# Patient Record
Sex: Female | Born: 1970 | Race: White | Hispanic: No | Marital: Married | Smoking: Former smoker
Health system: Southern US, Community
[De-identification: ages and names within clinical notes are randomized; demographics above are authoritative.]

## PROBLEM LIST (undated history)

## (undated) DIAGNOSIS — E079 Disorder of thyroid, unspecified: Secondary | ICD-10-CM

## (undated) DIAGNOSIS — T7840XA Allergy, unspecified, initial encounter: Secondary | ICD-10-CM

## (undated) DIAGNOSIS — E119 Type 2 diabetes mellitus without complications: Secondary | ICD-10-CM

## (undated) HISTORY — PX: FRACTURE SURGERY: SHX138

## (undated) HISTORY — DX: Disorder of thyroid, unspecified: E07.9

## (undated) HISTORY — DX: Type 2 diabetes mellitus without complications: E11.9

## (undated) HISTORY — DX: Allergy, unspecified, initial encounter: T78.40XA

---

## 2006-03-19 ENCOUNTER — Emergency Department: Payer: Self-pay | Admitting: Emergency Medicine

## 2006-10-16 ENCOUNTER — Ambulatory Visit: Payer: Self-pay | Admitting: Gastroenterology

## 2007-12-02 ENCOUNTER — Ambulatory Visit: Payer: Self-pay | Admitting: Unknown Physician Specialty

## 2009-07-18 ENCOUNTER — Ambulatory Visit: Payer: Self-pay | Admitting: Unknown Physician Specialty

## 2009-08-31 ENCOUNTER — Emergency Department: Payer: Self-pay | Admitting: Emergency Medicine

## 2012-12-13 ENCOUNTER — Inpatient Hospital Stay: Payer: Self-pay | Admitting: Unknown Physician Specialty

## 2012-12-13 LAB — CBC
HCT: 44.5 % (ref 35.0–47.0)
HGB: 14.6 g/dL (ref 12.0–16.0)
MCH: 27.3 pg (ref 26.0–34.0)
MCHC: 32.8 g/dL (ref 32.0–36.0)
Platelet: 302 10*3/uL (ref 150–440)
WBC: 11.8 10*3/uL — ABNORMAL HIGH (ref 3.6–11.0)

## 2012-12-13 LAB — COMPREHENSIVE METABOLIC PANEL
Alkaline Phosphatase: 95 U/L (ref 50–136)
BUN: 12 mg/dL (ref 7–18)
Bilirubin,Total: 0.5 mg/dL (ref 0.2–1.0)
Calcium, Total: 9.6 mg/dL (ref 8.5–10.1)
EGFR (African American): >60
EGFR (Non-African Amer.): >60
Glucose: 138 mg/dL — ABNORMAL HIGH (ref 65–99)
Osmolality: 279 (ref 275–301)
Potassium: 3.6 mmol/L (ref 3.5–5.1)
SGPT (ALT): 16 U/L (ref 12–78)
Total Protein: 7.7 g/dL (ref 6.4–8.2)

## 2012-12-15 LAB — BASIC METABOLIC PANEL
BUN: 7 mg/dL (ref 7–18)
Chloride: 103 mmol/L (ref 98–107)
Creatinine: 0.63 mg/dL (ref 0.60–1.30)
EGFR (African American): >60
EGFR (Non-African Amer.): >60
Glucose: 172 mg/dL — ABNORMAL HIGH (ref 65–99)
Potassium: 3.9 mmol/L (ref 3.5–5.1)
Sodium: 132 mmol/L — ABNORMAL LOW (ref 136–145)

## 2012-12-15 LAB — PLATELET COUNT: Platelet: 216 10*3/uL (ref 150–440)

## 2012-12-16 LAB — BASIC METABOLIC PANEL
Anion Gap: 6 — ABNORMAL LOW (ref 7–16)
BUN: 5 mg/dL — ABNORMAL LOW (ref 7–18)
Chloride: 106 mmol/L (ref 98–107)
Creatinine: 0.55 mg/dL — ABNORMAL LOW (ref 0.60–1.30)
EGFR (Non-African Amer.): >60
Glucose: 92 mg/dL (ref 65–99)
Sodium: 138 mmol/L (ref 136–145)

## 2012-12-16 LAB — HEMOGLOBIN: HGB: 13.1 g/dL (ref 12.0–16.0)

## 2013-08-05 ENCOUNTER — Ambulatory Visit: Payer: Self-pay | Admitting: Urology

## 2013-10-20 ENCOUNTER — Ambulatory Visit: Payer: Self-pay | Admitting: Physician Assistant

## 2015-02-15 LAB — LIPID PANEL
Cholesterol: 124 mg/dL (ref 0–200)
HDL: 57 mg/dL (ref 35–70)
LDL CALC: 56 mg/dL
Triglycerides: 55 mg/dL (ref 40–160)

## 2015-02-15 LAB — TSH: TSH: 5.47 u[IU]/mL (ref 0.41–5.90)

## 2015-02-15 LAB — CBC AND DIFFERENTIAL
HCT: 29 % — AB (ref 36–46)
HEMOGLOBIN: 8 g/dL — AB (ref 12.0–16.0)
Platelets: 296 10*3/uL (ref 150–399)
WBC: 7.2 10^3/mL

## 2015-02-15 LAB — BASIC METABOLIC PANEL
BUN: 13 mg/dL (ref 4–21)
Creatinine: 0.7 mg/dL (ref 0.5–1.1)
Glucose: 273 mg/dL
Potassium: 4 mmol/L (ref 3.4–5.3)
Sodium: 133 mmol/L — AB (ref 137–147)

## 2015-02-15 LAB — HEMOGLOBIN A1C: Hgb A1c MFr Bld: 11.4 % — AB (ref 4.0–6.0)

## 2015-02-15 LAB — HEPATIC FUNCTION PANEL
ALK PHOS: 73 U/L (ref 25–125)
ALT: 4 U/L — AB (ref 7–35)
AST: 11 U/L — AB (ref 13–35)
BILIRUBIN, TOTAL: 0.5 mg/dL

## 2015-02-23 ENCOUNTER — Ambulatory Visit: Payer: Self-pay | Admitting: Physician Assistant

## 2015-03-07 ENCOUNTER — Encounter: Payer: Self-pay | Admitting: Endocrinology

## 2015-03-07 ENCOUNTER — Ambulatory Visit (INDEPENDENT_AMBULATORY_CARE_PROVIDER_SITE_OTHER): Payer: BC Managed Care – PPO | Admitting: Endocrinology

## 2015-03-07 VITALS — BP 122/68 | HR 72 | Resp 14 | Wt 160.5 lb

## 2015-03-07 DIAGNOSIS — E1065 Type 1 diabetes mellitus with hyperglycemia: Secondary | ICD-10-CM

## 2015-03-07 DIAGNOSIS — E10319 Type 1 diabetes mellitus with unspecified diabetic retinopathy without macular edema: Secondary | ICD-10-CM

## 2015-03-07 DIAGNOSIS — E038 Other specified hypothyroidism: Secondary | ICD-10-CM | POA: Diagnosis not present

## 2015-03-07 DIAGNOSIS — D649 Anemia, unspecified: Secondary | ICD-10-CM | POA: Insufficient documentation

## 2015-03-07 DIAGNOSIS — E039 Hypothyroidism, unspecified: Secondary | ICD-10-CM | POA: Insufficient documentation

## 2015-03-07 DIAGNOSIS — IMO0002 Reserved for concepts with insufficient information to code with codable children: Secondary | ICD-10-CM | POA: Insufficient documentation

## 2015-03-07 LAB — HM DIABETES FOOT EXAM: HM DIABETIC FOOT EXAM: ABNORMAL

## 2015-03-07 NOTE — Progress Notes (Signed)
REASON FOR VISIT- Melissa Gross is a 44 y.o.-year-old female, referred by her PCP, Maurine Minister, PA, for management of Type 1 Diabetes Mellitus, uncontrolled, with complications ( Retinopathy, ?minimal neuropathy) and Hashimoto's hypothyroidism. She has seen Dr Reuel Derby in the past, about 6 months ago, but is not planning on following up there anymore.   HPI- Patient recalls being diagnosed with diabetes in 71 ( age 33). Following this, she started on insulin therapy at diagnosis. Tried HumulinR and NPH and Humalog in the past. Around age 73, switched to insulin pump. Current Medtronic pump is about 42 years old.   Patient is currently on  regimen with  - Novolog in the pump.   her most recent  A1cs were recorded at  Lab Results  Component Value Date   HGBA1C 11.4* 02/15/2015     Pump settings: - basal rates: 12 am: 1.1 units/h 5am: 1.4 units/h 7am:1.1 units/hr TDD from basal insulin: 27 units - ICR: 10 - target:120 - ISF:50 - Insulin on Board:4 hours - bolus wizard: on TDD from bolus insulin: ~15.7 units ( 37%) - extended bolusing: not using - changes infusion site: q 2-3 days   Patient checks her sugars <1  times daily with a Contour next glucometer. By recall/meter download/review her sugars are-  * about 4 readings over past 2 weeks  PREMEAL Breakfast Lunch Dinner Bedtime Overall  Glucose range:     174->400  Mean/median:        POST-MEAL PC Breakfast PC Lunch PC Dinner  Glucose range:     Mean/median:       Hypoglycemia-  One lows in the morning after an overcorrection at night. Lowest sugar was n/a; she has hypoglycemia awareness at 100s range. No previous hypoglycemia admission.  Hyperglycemia-  No previous DKA admissions recently.    Dietary Habits- Eats three times daily. Does Carbohydrate counting, but since food choices are limited- now she is eyeballing them.  BF- PB pretzels, yogurt,  lunch - chicken baked, veggies, fruit ( consistently eats  at cafeteria at school) Supper-  Has 2 young boys at home- meals catered to their choice, mac cheese, pasta 8 glasses  diet sodas daily Exercise- used to walk, but chest palps, stress test done recently last week>>awaiting results Weight- Weight has been stable recently.  Wt Readings from Last 3 Encounters:  03/07/15 160 lb 8 oz (72.802 kg)    Diabetes Complications- No known complications Nephropathy-- No  CKD, last BUN/creatinine-GFR 92 Lab Results  Component Value Date   BUN 13 02/15/2015   CREATININE 0.7 02/15/2015   Negative urine MA 01/2015  Retinopathy- Yes Last DEE was in Feb 2016 , s/p laser treatments bilaterally Neuropathy- some numbness and tingling in her feet/hands. Associated history - No history of CAD or prior stroke. No Hypertension. Has hypothyroidism since age 33 years.  her last TSH was  Lab Results  Component Value Date   TSH 5.47 02/15/2015  normal TSH 0.34-5.6. She is on Synthroid 125 mcg daily. Missed 3 tablets in past 2 months Now using pill box Taking iron lately later in day due to low HB ROS as below Remote hx of over 15 years ago of serial [place] monitoring and FNA ( benign per her report)    No hyperlipidemia. her last set of lipids were-  Lab Results  Component Value Date   CHOL 124 02/15/2015   HDL 57 02/15/2015   LDLCALC 56 02/15/2015   TRIG 55 02/15/2015   I have reviewed  the patient's past medical history, family and social history, surgical history, medications and allergies.    Past Medical History  Diagnosis Date  . Diabetes mellitus without complication   . Thyroid disease   . Allergy    Past Surgical History  Procedure Laterality Date  . Fracture surgery    . Cesarean section     Family History  Problem Relation Age of Onset  . Diabetes Other    History   Social History  . Marital Status: Married    Spouse Name: N/A  . Number of Children: N/A  . Years of Education: N/A   Occupational History  . Not on file.    Social History Main Topics  . Smoking status: Former Games developer  . Smokeless tobacco: Never Used  . Alcohol Use: No  . Drug Use: No  . Sexual Activity: Not on file   Other Topics Concern  . Not on file   Social History Narrative  . No narrative on file   No current outpatient prescriptions on file prior to visit.   No current facility-administered medications on file prior to visit.   Allergies  Allergen Reactions  . Codeine Itching    REVIEW OF SYSTEMS- Review of Systems:  complains of  [  ] denies General:   [  ] Recent weight change [ x ] Fatigue  [  ] Loss of appetite Eyes: [ x ]  Vision Difficulty [  ]  Eye pain ENT: [  ]  Hearing difficulty [  ]  Difficulty Swallowing CVS: [  ] Chest pain [  ]  Palpitations/Irregular Heart beat [  ]  Shortness of breath lying flat [  ] Swelling of legs Resp: [  ] Frequent Cough [  ] Shortness of Breath  [  ]  Wheezing GI: [  ] Heartburn  [  ] Nausea or Vomiting  [  ] Diarrhea [  ] Constipation  [  ] Abdominal Pain GU: [  ]  Polyuria  [  ]  nocturia Bones/joints:  [  ]  Muscle aches  [  ] Joint Pain  [  ] Bone pain Skin/Hair/Nails: [  ]  Rash  [  ] New stretch marks [  ]  Itching [  ] Hair loss [  ]  Excessive hair growth Reproduction: [  ] Low sexual desire , [  ]  Women: Menstrual cycle problems [  ]  Women: Breast Discharge [  ] Men: Difficulty with erections [  ]  Men: Enlarged Breasts CNS: [  ] Frequent Headaches [  ] Blurry vision [  ] Tremors [  ] Seizures [  ] Loss of consciousness [  ] Localized weakness Endocrine: [  ]  Excess thirst [  ]  Feeling excessively hot [  ]  Feeling excessively cold Heme: [  ]  Easy bruising [  ]  Enlarged glands or lumps in neck Allergy: [  ]  Food allergies [  ] Environmental allergies  PHYSICAL EXAM- BP 122/68 mmHg  Pulse 72  Resp 14  Wt 160 lb 8 oz (72.802 kg)  SpO2 99% Wt Readings from Last 3 Encounters:  03/07/15 160 lb 8 oz (72.802 kg)   GENERAL: No acute distress HEENT:  Eye exam  shows normal external appearance. Oral exam shows normal mucosa .  NECK:   Neck exam shows no lymphadenopathy. No Carotids bruits. Thyroid is not enlarged and no nodules felt.   LUNGS:  Chest is symmetrical. Lungs are clear to auscultation.Marland Kitchen.   HEART:         Heart sounds:  S1 and S2 are normal. No murmurs or clicks heard. ABDOMEN:  No Distention present. Liver and spleen are not palpable. No other mass or tenderness present.  EXTREMITIES:     There is no edema. No skin lesions present. 2+ DP.Marland Kitchen.  NEUROLOGICAL:     Grossly intact. 2+ reflexes at biceps bilaterally.             Diabetic foot exam done with shoes and socks removed: patchy decrease in Normal Monofilament testing bilaterally specially at some places distally over toes. No deformity of Toes.  Skin color normal.  MUSCULOSKELETAL:       There is no enlargement or deformity of the joints.  SKIN:       No rash, lesions        ASSESSMENT/PLAN- Problem List Items Addressed This Visit      Endocrine   Hypothyroidism    Appears to be clinically euthyroid at this time. Recent TSH at higher end of normal- she probably would need a higher dose of levothyroxine. The patient has had recent compliance issues with taking her levothyroxine. Asked her to set up phone reminders and take the pill as directed. Recheck in 6 weeks and will adjust dose based on those readings.       Relevant Medications   levothyroxine (SYNTHROID, LEVOTHROID) 125 MCG tablet     Other   Type 1 diabetes, uncontrolled, with retinopathy - Primary    Asked her to concentrate on bolus calculations, carb counting and measuring foods, and using premeal sugars for carb calculations.  She is not checking enough and I have asked her to check 4-6 times daily while on pump therapy.  Discussed that recent A1c and sugars are not at target, and would recommend goal A1c at 7% to minimize progression of DM complications.  She is agreeable to the recommendations. Will continue  current insulin pump settings for now. If she were to start developing low sugars after using the bolus wizard, then she is aware to let me know for interim pump adjustments.   Reviewed Hypoglycemia recognition and treatment, foot care, eye exams, FS checks and med compliance. Discussed role of diet and exercise.       Relevant Medications   insulin aspart (NOVOLOG) 100 UNIT/ML injection        - Return to clinic in 2weeks with meter      Tashia Leiterman [facility] 03/07/2015, 2:09 PM

## 2015-03-07 NOTE — Assessment & Plan Note (Signed)
Asked her to concentrate on bolus calculations, carb counting and measuring foods, and using premeal sugars for carb calculations.  She is not checking enough and I have asked her to check 4-6 times daily while on pump therapy.  Discussed that recent A1c and sugars are not at target, and would recommend goal A1c at 7% to minimize progression of DM complications.  She is agreeable to the recommendations. Will continue current insulin pump settings for now. If she were to start developing low sugars after using the bolus wizard, then she is aware to let me know for interim pump adjustments.   Reviewed Hypoglycemia recognition and treatment, foot care, eye exams, FS checks and med compliance. Discussed role of diet and exercise.

## 2015-03-07 NOTE — Patient Instructions (Signed)
Check sugars 4-6 x daily ( before each meal and at bedtime).  Record them in a log book and bring that/meter to next appointment. Bolus with each meal according to carbs. Notify if getting lows.   Please come back for a follow-up appointment in 2 weeks

## 2015-03-07 NOTE — Assessment & Plan Note (Signed)
Appears to be clinically euthyroid at this time. Recent TSH at higher end of normal- she probably would need a higher dose of levothyroxine. The patient has had recent compliance issues with taking her levothyroxine. Asked her to set up phone reminders and take the pill as directed. Recheck in 6 weeks and will adjust dose based on those readings.

## 2015-03-07 NOTE — Progress Notes (Signed)
Pre visit review using our clinic review tool, if applicable. No additional management support is needed unless otherwise documented below in the visit note. 

## 2015-03-22 ENCOUNTER — Ambulatory Visit: Payer: Self-pay | Admitting: Endocrinology

## 2015-03-24 NOTE — H&P (Signed)
PATIENT NAME:  Melissa Gross, Melissa Gross MR#:  [id number] DATE OF BIRTH:  15-Oct-1971  DATE OF ADMISSION:  12/13/2012  CHIEF COMPLAINT:  A 44 year old female with left knee pain.   HISTORY OF PRESENT ILLNESS:  The patient was chasing her child, fell and injured her left knee. She was unable to ambulate. She was brought to the Emergency Room where x-rays revealed a medial tibial plateau fracture.   She denies any other injuries. No prior knee difficulty.   She has had some burning in both feet which has been attributed to her diabetes.   PAST MEDICAL HISTORY:  Notable for diabetes.   DRUG ALLERGIES:  PERCOCET WHICH CAUSES A SIGNIFICANT RASH.   PAST SURGICAL HISTORY:  Appendectomy and C-section.   REVIEW OF SYSTEMS:  Unremarkable for any acute cardiorespiratory, GI, or GU symptoms. No fevers, chills, or constitutional symptoms.   SOCIAL HISTORY:  The patient does not smoke or drink. She is married.   MEDICATIONS:  Insulin pump, Synthroid, Peri-Colace, Flonase.   PHYSICAL EXAMINATION: GENERAL: Well-developed female. She is alert and oriented in moderate discomfort.  VITALS: Blood pressure as per ER sheet, this was checked.  HEENT: PERRLA, EOMI.  NECK: Supple.  LUNGS: Clear.  HEART: Normal.  ABDOMEN: Benign.  UPPER EXTREMITIES: Full range of motion with normal neurovascular examination.  SPINE: Cervical, thoracic and lumbar spine is nontender.  RIGHT LOWER EXTREMITY: Hip, knee, foot and ankle have a good range of motion with normal neurovascular examination.  LEFT LOWER EXTREMITY: Hip is nontender. The left knee reveals mild effusion. Significant medial tenderness at the proximal tibia. Foot and ankle are unremarkable. There is slight diffuse decreased sensation about the foot and ankle. There is some hypersensitivity about the left foot greater than right foot.   DIAGNOSTIC DATA:  X-ray and CT scan of the knee revealed a medial comminuted depressed tibial plateau fracture. Laboratory work:  MET-B is unremarkable. Her blood sugar is 138. Hemogram is notable for a white blood cell count of 11.8. CT scan reveals a comminuted impacted fracture of the proximal tibial metaphysis with depression. Fracture the adjacent fibular head. There is widening of the metaphysis.   IMPRESSION:   1.  Left medial tibial plateau fracture with significant depression and widening. Options are discussed with the patient. She feels she is having too much pain to go home.  2.  Diabetes on insulin pump. Some early diabetic neuropathy issues with both legs and feet. She has not been treated with any medication for this.  3.  REACTION TO PERCOCET WITH SIGNIFICANT RASH IN THE PAST.   PLAN:   1.  Options were discussed with the patient. She does not feel like she can go home. She is placed in a well-padded knee immobilized with a Polar Care. She will be seen by Dr. Marry Guan tomorrow.  2.  Diabetes. We will get medical consult.  3.  Component of diabetic peripheral neuropathy. We will start her on gabapentin.    ____________________________ Alysia Penna. Mauri Pole, MD jcc:si D: 12/13/2012 21:25:41 ET T: 12/13/2012 23:27:49 ET JOB#: [id number]  cc: Alysia Penna. Mauri Pole, MD, <Dictator> Alysia Penna Hayven Fatima MD ELECTRONICALLY SIGNED 01/28/2013 5:29

## 2015-03-24 NOTE — Discharge Summary (Signed)
PATIENT NAME:  Melissa Gross, Melissa Gross MR#:  [id number] DATE OF BIRTH:  1971-10-22  DATE OF ADMISSION:  12/13/2012 DATE OF DISCHARGE:  12/17/2012  ADDENDUM, CONTINUATION:    PROCEDURE PERFORMED:  Open reduction/internal fixation of left medial tibial plateau fracture.   ANESTHESIA: Spinal.   IMPLANTS UTILIZED: Synthes four hole, 3.5 mm LCP medial proximal tibial plate, six 3.5 mm locking screws and one 3.5 mm cortical screw.   HOSPITAL COURSE: The patient tolerated the procedure very well. She had no complications. She was then taken to the PAC-U where she was stabilized and then transferred to the floor. The patient began receiving anticoagulation therapy of Lovenox 30 mg subcutaneous q.12 hours per anesthesia and pharmacy protocol. She was fitted with TED stockings on the nonoperative leg. Heels were elevated off the bed using rolled towels. She was fitted with AV-I compression foot pumps set at 80 mmHg.  She has had no evidence of any DVTs of the lower extremity.   The patient complained of a lot of burning and causalgia type of pain. She is a diabetic and subsequently has been taking gabapentin for this in the past. She also has a history of cramping.  A heating pad was applied to the left foot to help this, which seemed to do a lot for her during her hospital course. The patient was noted to have anxiety attacks throughout the hospital course.  Anytime anybody started to do anything for her, she would go into panic mode. Her pain issue was anywhere from 0-10.  Initially, she would have a 10, then she would be asleep and then she would wake up with a pain level of 10.  This seemed to be controlled with Tylenol and Tramadol and by the time she was leaving it was a level 2 or 3.  She was given a little bit of Lyrica also, which at the time of discharge, was not really taking much of this.  She seemed to tolerate the Tylenol and Tramadol very well.    Physical therapy was initiated on day one for gait  training and transfers. She is to be non-weightbearing to the left leg. She was fitted with a range of motion brace. She was allowed to be in full extension with flexing to more than 90 degrees, but was not allowed to be forced into 90-degrees, staying in the neighborhood of 60-degrees, unless the patient tolerated the procedure/manipulation well.  Occupational therapy was also initiated for activities of daily living and assistive devices.  The patient IV, Foley and Hemovac were discontinued on day two along with a dressing change. The wound was free of any drainage or signs of any infection. The patient's Dilaudid was discontinued on day two and subsequently once again, the patient went to a panic attack, stating that was the only thing that worked for her. After some discussion of this, she finally calmed down.  Overall, this was an uneventful hospital course.  PLAN: 1.  The patient is discharged to home in improved stable condition. She was to be non-weightbearing to the left leg. She will receive home health physical therapy.  2.  Continue wearing her range of motion brace at all times.  Work on range of motion, no more than 90-degrees but not to force it into 90-degrees. 3.  She is placed on an ADA diet.  4.  Resume her regular medication that she was on prior to admission.  5.  Follow-up in the clinic in 7 to 10 days for  re-evaluation and wound check.  6.  No shower until the staples are removed.  7.  Medications, Lyrica 50 to 100 mg every 4 to 6 hours p.r.n. was given; Tramadol 50 to 100 mg q.4 to 6 hours p.r.n. for pain; Lovenox 40 mg subcutaneously daily for 14 days, then discontinue and begin taking 181 mg enteric-coated aspirin.  8.  The patient is to call the clinic with any temperatures of 101.5 or greater or excessive bleeding.   PAST MEDICAL HISTORY:  1.  Hypothyroidism.  2.  Diabetes.  3.  History of gastroesophageal reflux disease.    ____________________________ Van ClinesJon Wolfe,  PA jrw:eg D: 12/21/2012 14:18:31 ET T: 12/21/2012 22:03:02 ET JOB#: [id number]  cc: Van ClinesJon Wolfe, PA, <Dictator> JON WOLFE PA ELECTRONICALLY SIGNED 12/22/2012 20:05

## 2015-03-24 NOTE — Op Note (Signed)
PATIENT NAME:  Melissa Gross, Melissa Gross MR#:  [id number] DATE OF BIRTH:  28-May-1971  DATE OF PROCEDURE:  12/14/2012  PREOPERATIVE DIAGNOSIS: Left medial tibial plateau fracture.   POSTOPERATIVE DIAGNOSIS: Left medial tibial plateau fracture.   PROCEDURE PERFORMED: Open reduction and internal fixation of left medial tibial plateau fracture.   SURGEON: Francesco SorJames Hooten, MD.  ANESTHESIA: Spinal.   ESTIMATED BLOOD LOSS: 50 mL.   FLUIDS REPLACED: 800 mL of crystalloid.   TOURNIQUET TIME: 110 minutes.   DRAINS: None.  IMPLANTS UTILIZED: Synthes 4-hole, 3.5 mm LCP medial proximal tibial plate, six 3.5 mm locking screws, and one 3.5 mm cortical screw.   INDICATIONS FOR SURGERY: The patient is a 44 year old female, who fell yesterday and sustained a fracture of the medial tibial plateau. Both radiographs and a CT scan demonstrated displacement. After discussion of the risks and benefits of surgical intervention, the patient expressed understanding of the risks and benefits and agreed with plans for surgical intervention.   PROCEDURE IN DETAIL: The patient was brought into the operating room, after adequate spinal anesthesia was achieved, a tourniquet was placed on the patient's upper left thigh. The patient's left knee and leg were cleaned and prepped with alcohol and DuraPrep and draped in the usual sterile fashion. A "timeout" was performed as per usual protocol. The left lower extremity was exsanguinated using an Esmarch and the tourniquet was inflated to 300 mmHg. A longitudinal incision was made along the medial flare of the tibia. Dissection was carried down to the joint line. Fracture-dislocation was identified and joint line was also identified. An incision was made so as to elevate the medial meniscus so as to better visualize the medial tibial plateau. Large hemarthrosis was evacuated. A provisional reduction was performed using bone reduction forceps. A 4-hole, 3.5 mm LCP medial proximal tibial plate  was positioned along the medial aspect of the joint and provisionally maintained using K wires. The joint was again inspected and felt to be good restoration of the joint surface. The plate was then secured using a 3.5 mm cortical screw distally.   Next, three 3.5 mm locking screws were inserted proximally forming a scaffold maintaining position. This was followed by placement of distal 3.5 mm locking screw and the 2 kickstand locking screws. The knee was visualized in multiple planes using Gross-arm with good restoration of the joint line and good reduction. The joint space was again inspected by elevating the meniscus with anatomic reduction appreciated. The wound was irrigated with copious amounts of normal saline with antibiotic solution. The tourniquet was deflated after a total tourniquet time of 110 minutes. The meniscus was repaired using interrupted sutures of 2-0 Ethibond. Deep fascia was reapproximated using interrupted sutures of #1 Vicryl. The subcutaneous tissue was approximated in layers using first #0 Vicryl followed by 2-0 Vicryl. The skin was closed with skin staples. A sterile dressing was applied followed by application of range of motion knee brace. The patient tolerated the procedure well. She was transported to the recovery room in stable condition.    ____________________________ Illene LabradorJames P. Angie FavaHooten Jr., MD jph:aw D: 12/14/2012 20:26:15 ET T: 12/15/2012 07:32:35 ET JOB#: [id number]  cc: Illene LabradorJames P. Angie FavaHooten Jr., MD, <Dictator> JAMES P Angie FavaHOOTEN JR MD ELECTRONICALLY SIGNED 12/18/2012 7:12

## 2015-03-24 NOTE — Consult Note (Signed)
PATIENT NAME:  Melissa Gross, [name] MR#:  [id number] DATE OF BIRTH:  1971/07/04  DATE OF CONSULTATION:  12/13/2012  REFERRING PHYSICIAN:  Ruthann CancerJames Califf, MD  CONSULTING PHYSICIAN:  Carney CornersAmir M. Lenni Reckner, MD  REASON FOR CONSULTATION: The patient has left tibia fracture status post fall. She is on insulin pump.   HISTORY OF THE PRESENT ILLNESS: The patient is a 44 year old, pleasant Caucasian female with history of diabetes mellitus on insulin, hypothyroidism, and history of gastroesophageal reflux disease. The patient was admitted to the hospital when she fractured her left tibia when she was chasing her 42-year-old son and she tripped and fell. The plan for surgery is tomorrow.   REVIEW OF SYSTEMS:  CONSTITUTIONAL: Denies having any fever, no chills, no fatigue.  EYES: No blurring of vision, no double vision.  ENT: No hearing impairment, no sore throat, no dysphagia.  CARDIOVASCULAR: No chest pain, no shortness of breath, no edema, no syncope.  RESPIRATORY: No cough, no sputum production, no chest pain, no shortness of breath.  GASTROINTESTINAL: No vomiting, no diarrhea. She has mild nausea, feels this started after taking the pain medication.  GENITOURINARY: No dysuria, no frequency of urination.  MUSCULOSKELETAL: No joint pain or swelling other than the pain in the left leg. No muscular pain or swelling.  INTEGUMENTARY: No skin rash, no ulcers.  NEUROLOGY: No focal weakness, no seizure activity, no headache.  PSYCHIATRY: She has a little anxiety today. No depression.  ENDOCRINE: No polyuria or polydipsia. No heat or cold intolerance.  HEMATOLOGY: No easy bruisability, no lymph node enlargement.   PAST MEDICAL HISTORY: Insulin-dependent diabetes mellitus, she is using insulin pump with basal insulin in addition to sliding scale, history of Hashimoto thyroiditis with resultant hypothyroidism on replacement therapy, gastroesophageal reflux disease, and history of allergic pruritus of skin for which she  takes antihistamine.   PAST SURGICAL HISTORY: Tonsillectomy, C-section x 2. The patient also had a tubal ligation.   FAMILY HISTORY: She has no information about her father as he left when she was young, but she has information that on his side of the family, there is diabetes. Her mother had surgery for brain meningioma. She has a grandmother who had passed away after having uterine cancer.   SOCIAL HABITS: Nonsmoker. No history of alcohol or drug abuse.   SOCIAL HISTORY: She is married, living with her husband. She is a Chartered loss adjusterschoolteacher, teaching first grade.  ADMISSION MEDICATIONS: Synthroid or Levothroid, she does not know the dose, Prilosec, also does not know the dose, Peri-Colace two tablets a day, Flonase nasal spray, two sprays once a day, Zyrtec 10 mg daily, insulin using the insulin pump with Humalog.   ALLERGIES: Reported ALLERGIC TO PERCOCET CAUSING SKIN RASH.   PHYSICAL EXAMINATION:  VITAL SIGNS: Blood pressure 139/78, respiratory rate 16, pulse 80, temperature 99, oxygen saturation 99%.  GENERAL APPEARANCE: A young female lying down in bed in no acute distress.  HEAD AND NECK: No pallor. No icterus. No cyanosis.  ENT: Examination revealed normal hearing. No discharge, no ulcers. Nasal mucosa was normal without ulcers, no discharge, no bleeding. Oropharyngeal area was normal without oral thrush. No exudate. No ulcers.  EYES: Examination revealed normal iris and conjunctivae. Pupils about 4 to 5 mm, round, equal, and reactive to light.  NECK: Supple. Trachea at midline. No thyromegaly, no cervical lymphadenopathy, no masses.  HEART: Normal S1, S2. No S3, S4. No murmur, no gallop, no carotid bruits.  RESPIRATORY: Examination revealed normal breathing pattern without use of accessory muscles. No  rales. No wheezing.  ABDOMEN: Soft without tenderness. No hepatosplenomegaly. No masses. No hernias.  SKIN: No ulcers. No subcutaneous nodules.  MUSCULOSKELETAL: No joint swelling. No  clubbing. The left leg is placed in immobilizer.  NEUROLOGICAL: Cranial nerves II through XII are intact. No focal motor deficit. Sense of touch is preserved.  PSYCHIATRIC: The patient is alert and oriented x 3. Mood and affect were normal.   LABORATORY FINDINGS: CAT scan of the left knee showed comminuted impacted fracture of the proximal tibial metaphysis with depression of portions of the medial tibial plateau. There is a fracture of the adjacent fibular head. Serum glucose 138, BUN 12, creatinine 0.7, sodium 139, potassium 3.6, calcium 9.6. Normal liver function tests and liver transaminases. CBC showed white count of 11,000, hemoglobin 14, hematocrit 44, platelet count 302.   IMPRESSION:  1. Left tibia fracture status post fall.  2. Insulin-dependent diabetes mellitus with insulin pump. 3. Gastroesophageal reflux disease. 4. Hypothyroidism.   PLAN: I asked the patient to shut down the insulin pump since we are unsure about her intake of calories. She will be n.p.o. in one hour until the time of the surgery. She will be placed on insulin sliding scale accordingly. Regarding her thyroid medicine, she does not know her dose, but this should not be a problem. This can be resumed upon discharge tomorrow. I will resume Zyrtec 10 mg a day since she has a significant history of pruritus secondary to medications and other allergies.   Time spent in evaluating this patient took more than 40 minutes.      ____________________________ Carney Corners. Rudene Re, MD amd:es D: 12/13/2012 23:27:26 ET T: 12/14/2012 09:12:40 ET JOB#: [id number]  cc: Carney Corners. Rudene Re, MD, <Dictator> Zollie Scale MD ELECTRONICALLY SIGNED 12/15/2012 7:19

## 2015-03-24 NOTE — Discharge Summary (Signed)
PATIENT NAME:  Melissa Gross, Melissa Gross MR#:  [id number] DATE OF BIRTH:  December 10, 1970  DATE OF ADMISSION:  12/13/2012 DATE OF DISCHARGE:  12/17/2012  ADMITTING PHYSICIAN: Dr. Ruthann CancerJames Califf, locum for the weekend    ADMITTING DIAGNOSIS: Left tibial plateau fracture.   DISCHARGE DIAGNOSIS: Left tibial plateau fracture.  CONSULTATIONS: Hospitalist, Dr. Rudene Rearwish.    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 She presented to the Emergency Room where x-rays revealed a fracture of the tibial plateau. Due to the severity of pain, the patient states she was unable to go home and subsequently was admitted. She did have a medical clearance performed by Dr. Rudene Rearwish on January 12th. This was secondary to fact that she had a history of diabetes mellitus and was on an insulin pump, hypothyroidism, and history of gastroesophageal reflux disease.   After discussion of the risks and benefits, Dr. Ernest PineHooten evaluated the patient on January 13th and discuss with the patient the risks, complications and outcome of surgery that was to be performed. The patient understood the risks and benefits of surgical intervention and agreed for plans for surgical procedure.   PROCEDURE: Open reduction and internal fixation of a left tibial plateau fracture with displacement.   HARDWARE UTILIZED:   Dictation ends here.  Please see addendum for continuation of dictation.  ____________________________ Melissa ClinesJon Vickii Volland, PA [email or web address] D: 12/21/2012 14:10:00 ET T: 12/21/2012 16:33:07 ET JOB#: [id number] cc: Melissa ClinesJon Dyron Kawano, PA, <Dictator> Demitrious Mccannon PA ELECTRONICALLY SIGNED 12/23/2012 11:05

## 2015-03-29 ENCOUNTER — Ambulatory Visit (INDEPENDENT_AMBULATORY_CARE_PROVIDER_SITE_OTHER): Payer: BC Managed Care – PPO | Admitting: Endocrinology

## 2015-03-29 ENCOUNTER — Encounter: Payer: Self-pay | Admitting: Endocrinology

## 2015-03-29 VITALS — BP 126/70 | HR 76 | Temp 97.7°F | Resp 16 | Ht 65.0 in | Wt 165.0 lb

## 2015-03-29 DIAGNOSIS — E1065 Type 1 diabetes mellitus with hyperglycemia: Secondary | ICD-10-CM | POA: Diagnosis not present

## 2015-03-29 DIAGNOSIS — E10319 Type 1 diabetes mellitus with unspecified diabetic retinopathy without macular edema: Secondary | ICD-10-CM | POA: Diagnosis not present

## 2015-03-29 DIAGNOSIS — IMO0002 Reserved for concepts with insufficient information to code with codable children: Secondary | ICD-10-CM

## 2015-03-29 DIAGNOSIS — D649 Anemia, unspecified: Secondary | ICD-10-CM | POA: Diagnosis not present

## 2015-03-29 NOTE — Progress Notes (Signed)
Pre visit review using our clinic review tool, if applicable. No additional management support is needed unless otherwise documented below in the visit note. 

## 2015-03-29 NOTE — Progress Notes (Signed)
REASON FOR VISIT- Melissa Gross is a 44 y.o.-year-old female, here for follow up management of Type 1 Diabetes Mellitus, uncontrolled, with complications ( Retinopathy, ?minimal neuropathy) and Hashimoto's hypothyroidism. Previously seen Dr Tedd Sias. Last seen by me 2 weeks ago   HPI- Patient recalls being diagnosed with diabetes in 54 ( age 57). Following this, she started on insulin therapy at diagnosis. Tried HumulinR and NPH and Humalog in the past. Around age 89, switched to insulin pump. Current Medtronic pump is about 42 years old.   Patient is currently on  regimen with  - Novolog in the pump.   her most recent  A1cs were recorded at  Lab Results  Component Value Date   HGBA1C 11.4* 02/15/2015     Pump settings: - basal rates: 12 am: 1.1 units/h 5am: 1.4 units/h 7am:1.1 units/hr TDD from basal insulin: 27 units - ICR: 10 - target:120 - ISF:50 - Insulin on Board:4 hours - bolus wizard: on TDD from bolus insulin: ~22 units ( 45%) - extended bolusing: not using - changes infusion site: q 2-3 days   Patient checks her sugars <1-3  times daily with a Contour next glucometer. By recall/meter download/review her sugars are-  * getting better with FS checks lately, though some days, she just doesn't check  *starting to see some normal sugars when using her boluses  PREMEAL Breakfast Lunch Dinner Bedtime Overall  Glucose range:     94->400  Mean/median:        POST-MEAL PC Breakfast PC Lunch PC Dinner  Glucose range:     Mean/median:       Hypoglycemia-  Reports having some lows post lunch due to over-correction- didn't check her sugar then. Lowest sugar was n/a; she has hypoglycemia awareness at 100s range. No previous hypoglycemia admission.  Hyperglycemia-  No previous DKA admissions recently.    Dietary Habits- Eats three times daily. Does Carbohydrate counting, but since food choices are limited- now she is back to measuring her foods and counting carbs.  Trying to make healthier choices. Decreased cravings for PB and pretzels after starting iron tabs  BF- PB pretzels, yogurt,  lunch - chicken baked, veggies, fruit ( consistently eats at cafeteria at school) Supper-  Has 2 young boys at home- meals catered to their choice, mac cheese, pasta 8 glasses  diet sodas daily Exercise- used to walk, but chest palps, stress test done recently last week>>reports that it was normal Weight- Weight has been stable recently.  Wt Readings from Last 3 Encounters:  03/29/15 165 lb (74.844 kg)  03/07/15 160 lb 8 oz (72.802 kg)    Diabetes Complications- No known complications Nephropathy-- No  CKD, last BUN/creatinine-GFR 92 Lab Results  Component Value Date   BUN 13 02/15/2015   CREATININE 0.7 02/15/2015   Negative urine MA 01/2015  Retinopathy- Yes Last DEE was in Feb 2016 , s/p laser treatments bilaterally Neuropathy- some numbness and tingling in her feet/hands. Associated history - No history of CAD or prior stroke. No Hypertension. Has hypothyroidism since age 79 years.  her last TSH was  Lab Results  Component Value Date   TSH 5.47 02/15/2015  normal TSH 0.34-5.6. She is on Synthroid 125 mcg daily. Missed 3 tablets in past 2 months Now using pill box Taking iron lately later in day due to low HB ROS as below Remote hx of over 15 years ago of serial [place] monitoring and FNA ( benign per her report)    No hyperlipidemia. her  last set of lipids were-  Lab Results  Component Value Date   CHOL 124 02/15/2015   HDL 57 02/15/2015   LDLCALC 56 02/15/2015   TRIG 55 02/15/2015   I have reviewed the patient's past medical history,  medications and allergies.    Current Outpatient Prescriptions on File Prior to Visit  Medication Sig Dispense Refill  . cetirizine (ZYRTEC) 10 MG tablet Take 10 mg by mouth daily.    Marland Kitchen CRANBERRY CONCENTRATE PO Take 1 capsule by mouth daily.    Marland Kitchen docusate sodium (COLACE) 100 MG capsule Take 100 mg by mouth daily.     . ferrous sulfate 325 (65 FE) MG tablet Take 325 mg by mouth 2 (two) times daily with a meal.    . fluticasone (FLONASE) 50 MCG/ACT nasal spray Spray 2 spray into both nostrils once a day NEEDS TO EST WITH NEW PCP AND NEW ENDO MD    . insulin aspart (NOVOLOG) 100 UNIT/ML injection Use via pump max 60 units per day.    . levothyroxine (SYNTHROID, LEVOTHROID) 125 MCG tablet take 1 tablet by mouth once daily    . Probiotic Product (PROBIOTIC DAILY PO) Take 1 capsule by mouth daily.     No current facility-administered medications on file prior to visit.   Allergies  Allergen Reactions  . Codeine Itching    Review of Systems- [ x ]  Complains of    [  ]  denies [  ] Recent weight change [  ]  Fatigue [  ] polydipsia [  ] polyuria [  ]  nocturia [  ]  vision difficulty [  ] chest pain [  ] shortness of breath [  ] leg swelling [  ] cough [  ] nausea/vomiting [  ] diarrhea [  ] constipation [  ] abdominal pain [  ]  tingling/numbness in extremities [  ]  concern with feet ( wounds/sores)   PHYSICAL EXAM- BP 126/70 mmHg  Pulse 76  Temp(Src) 97.7 F (36.5 C) (Oral)  Resp 16  Ht  (1.651 m)  Wt 165 lb (74.844 kg)  BMI 27.46 kg/m2  SpO2 99%  LMP 03/20/2015 Wt Readings from Last 3 Encounters:  03/29/15 165 lb (74.844 kg)  03/07/15 160 lb 8 oz (72.802 kg)   Exam: deferred  ASSESSMENT/PLAN- Problem List Items Addressed This Visit      Other   Type 1 diabetes, uncontrolled, with retinopathy - Primary    Had another long discussion on appropriate testing, importance of testing sugars and taking the insulin either as meal time bolus based on premeal sugars and carb intake or less preferable as corrective bolus based on post meal sugars ( she is forgetting to check her sugars before meal and then when she realizes that it is after her meal, she feels that checking her sugars then will not help her as they will be high due to missed bolus). Asked her to concentrate on bolus  calculations, carb counting and measuring foods, and using premeal sugars for carb calculations. Since she is having a tough time with FS checks, will schedule her for diagnostic CGM testing- hopefully with her being able to see her sugar trends-she will use her pump more for bolusing. Seeing her trends might encourage her to check her sugars more after that.  She is not checking enough and I have asked her to check 4-6 times daily while on pump therapy.  Discussed that recent A1c and sugars are  not at target, and would recommend goal A1c at 7% to minimize progression of DM complications.  She is agreeable to the recommendations. Will continue current insulin pump settings for now. If she were to start developing low sugars after using the bolus wizard, then she is aware to let me know for interim pump adjustments.   Congratulated her on recent effort with carb calculations and making better choices.            Relevant Orders   Celiac Disease Panel   B12   Amb Referral to Nutrition and Diabetic E   Anemia    Test for celiac disease given iron deficiency anemia in this patient with T1DM      Relevant Orders   Celiac Disease Panel   B12        - Return to clinic in 39month with meter 25 minutes spent with the patient, >50% time spent discussing above topics      Tynasia Mccaul [facility] 03/30/2015, 11:43 AM

## 2015-03-29 NOTE — Patient Instructions (Signed)
Labs today for celiac screening.  Set up for diagnostic CGM testing.  Use bolus wizard and check sugars 5 x daily.  Please come back for a follow-up appointment in 1 month.

## 2015-03-30 ENCOUNTER — Other Ambulatory Visit: Payer: Self-pay | Admitting: Endocrinology

## 2015-03-30 DIAGNOSIS — E10319 Type 1 diabetes mellitus with unspecified diabetic retinopathy without macular edema: Secondary | ICD-10-CM

## 2015-03-30 DIAGNOSIS — E1065 Type 1 diabetes mellitus with hyperglycemia: Principal | ICD-10-CM

## 2015-03-30 DIAGNOSIS — IMO0002 Reserved for concepts with insufficient information to code with codable children: Secondary | ICD-10-CM

## 2015-03-30 LAB — VITAMIN B12: Vitamin B-12: 470 pg/mL (ref 211–911)

## 2015-03-30 MED ORDER — INSULIN ASPART 100 UNIT/ML ~~LOC~~ SOLN: SUBCUTANEOUS | Status: AC

## 2015-03-30 NOTE — Assessment & Plan Note (Signed)
Test for celiac disease given iron deficiency anemia in this patient with T1DM

## 2015-03-30 NOTE — Assessment & Plan Note (Addendum)
Had another long discussion on appropriate testing, importance of testing sugars and taking the insulin either as meal time bolus based on premeal sugars and carb intake or less preferable as corrective bolus based on post meal sugars ( she is forgetting to check her sugars before meal and then when she realizes that it is after her meal, she feels that checking her sugars then will not help her as they will be high due to missed bolus). Asked her to concentrate on bolus calculations, carb counting and measuring foods, and using premeal sugars for carb calculations. Since she is having a tough time with FS checks, will schedule her for diagnostic CGM testing- hopefully with her being able to see her sugar trends-she will use her pump more for bolusing. Seeing her trends might encourage her to check her sugars more after that.  She is not checking enough and I have asked her to check 4-6 times daily while on pump therapy.  Discussed that recent A1c and sugars are not at target, and would recommend goal A1c at 7% to minimize progression of DM complications.  She is agreeable to the recommendations. Will continue current insulin pump settings for now. If she were to start developing low sugars after using the bolus wizard, then she is aware to let me know for interim pump adjustments.   Congratulated her on recent effort with carb calculations and making better choices.

## 2015-04-03 ENCOUNTER — Encounter: Payer: Self-pay | Admitting: *Deleted

## 2015-04-03 LAB — CELIAC DISEASE PANEL
Endomysial IgA: NEGATIVE
IGA/IMMUNOGLOBULIN A, SERUM: 112 mg/dL (ref 87–352)

## 2015-04-04 ENCOUNTER — Encounter: Payer: Self-pay | Admitting: *Deleted

## 2015-05-03 ENCOUNTER — Ambulatory Visit: Payer: BC Managed Care – PPO | Admitting: Endocrinology

## 2015-05-16 ENCOUNTER — Ambulatory Visit: Payer: Self-pay | Admitting: Endocrinology

## 2015-05-23 ENCOUNTER — Encounter: Payer: Self-pay | Admitting: *Deleted

## 2015-05-23 ENCOUNTER — Ambulatory Visit (INDEPENDENT_AMBULATORY_CARE_PROVIDER_SITE_OTHER): Payer: BC Managed Care – PPO | Admitting: Endocrinology

## 2015-05-23 ENCOUNTER — Encounter: Payer: Self-pay | Admitting: Endocrinology

## 2015-05-23 VITALS — BP 120/68 | HR 66 | Resp 12 | Ht 65.0 in | Wt 160.6 lb

## 2015-05-23 DIAGNOSIS — D649 Anemia, unspecified: Secondary | ICD-10-CM

## 2015-05-23 DIAGNOSIS — E038 Other specified hypothyroidism: Secondary | ICD-10-CM | POA: Diagnosis not present

## 2015-05-23 DIAGNOSIS — E10319 Type 1 diabetes mellitus with unspecified diabetic retinopathy without macular edema: Secondary | ICD-10-CM | POA: Diagnosis not present

## 2015-05-23 DIAGNOSIS — E1065 Type 1 diabetes mellitus with hyperglycemia: Secondary | ICD-10-CM

## 2015-05-23 DIAGNOSIS — IMO0002 Reserved for concepts with insufficient information to code with codable children: Secondary | ICD-10-CM

## 2015-05-23 LAB — COMPREHENSIVE METABOLIC PANEL
ALBUMIN: 4.2 g/dL (ref 3.5–5.2)
ALK PHOS: 65 U/L (ref 39–117)
ALT: 6 U/L (ref 0–35)
AST: 13 U/L (ref 0–37)
BUN: 10 mg/dL (ref 6–23)
CO2: 26 mEq/L (ref 19–32)
Calcium: 9.4 mg/dL (ref 8.4–10.5)
Chloride: 102 mEq/L (ref 96–112)
Creatinine, Ser: 0.68 mg/dL (ref 0.40–1.20)
GFR: 100.7 mL/min (ref >60.00)
GLUCOSE: 199 mg/dL — AB (ref 70–99)
POTASSIUM: 4.5 meq/L (ref 3.5–5.1)
SODIUM: 134 meq/L — AB (ref 135–145)
TOTAL PROTEIN: 6.6 g/dL (ref 6.0–8.3)
Total Bilirubin: 0.5 mg/dL (ref 0.2–1.2)

## 2015-05-23 LAB — CBC
HEMATOCRIT: 44.3 % (ref 36.0–46.0)
HEMOGLOBIN: 14.6 g/dL (ref 12.0–15.0)
MCHC: 33.1 g/dL (ref 30.0–36.0)
MCV: 82.2 fl (ref 78.0–100.0)
Platelets: 303 10*3/uL (ref 150.0–400.0)
RBC: 5.39 Mil/uL — AB (ref 3.87–5.11)
RDW: 12.5 % (ref 11.5–15.5)
WBC: 5.3 10*3/uL (ref 4.0–10.5)

## 2015-05-23 LAB — IBC PANEL
Iron: 59 ug/dL (ref 42–145)
Saturation Ratios: 16.8 % — ABNORMAL LOW (ref 20.0–50.0)
Transferrin: 251 mg/dL (ref 212.0–360.0)

## 2015-05-23 LAB — MICROALBUMIN / CREATININE URINE RATIO
CREATININE, U: 95.1 mg/dL
MICROALB/CREAT RATIO: 0.7 mg/g (ref 0.0–30.0)
Microalb, Ur: <0.7 mg/dL (ref 0.0–1.9)

## 2015-05-23 LAB — HEMOGLOBIN A1C: Hgb A1c MFr Bld: 9.9 % — ABNORMAL HIGH (ref 4.6–6.5)

## 2015-05-23 LAB — T4, FREE: Free T4: 0.97 ng/dL (ref 0.60–1.60)

## 2015-05-23 LAB — TSH: TSH: 5.37 u[IU]/mL — ABNORMAL HIGH (ref 0.35–4.50)

## 2015-05-23 NOTE — Assessment & Plan Note (Signed)
Will update her A1c as she is due for a check. She really hasnt been compliant with Fs checks and using the bolus feature on the pump like she should.  She hasnt had time to do the diagnostic CGM testing done to help US guide in making pump changes.  Now that I am leaving the practice, she is willing to set this testing up with her new provider and requests referral to be made for [facility] ( which I have placed).  Asked her to concentrate on bolus calculations, carb counting and measuring foods, and using premeal sugars for carb calculations. Hopefully with her being able to see her sugar trends after the diagnostic CGM testing-she will use her pump more for bolusing. Seeing her trends might encourage her to check her sugars more after that.   Will continue current insulin pump settings for now. If she were to start developing low sugars after using the bolus wizard, then she is aware to let me know for interim pump adjustments.

## 2015-05-23 NOTE — Patient Instructions (Addendum)
Schedule diagnostic CGM , pump changes to be made based on that  Labs today.  Please come back for a follow-up appointment in 6 weeks Follow up at [facility]

## 2015-05-23 NOTE — Progress Notes (Signed)
REASON FOR VISIT- Melissa Gross is a 44 y.o.-year-old female, here for follow up management of Type 1 Diabetes Mellitus, uncontrolled, with complications ( Retinopathy, ?minimal neuropathy) and Hashimoto's hypothyroidism. Previously seen Dr Tedd Sias. Last seen by me April 2016.   HPI- Patient recalls being diagnosed with diabetes in 64 ( age 59). Following this, she started on insulin therapy at diagnosis. Tried HumulinR and NPH and Humalog in the past. Around age 34, switched to insulin pump. Current Medtronic pump is about 42 years old.   *still not compliant with bolusing and checking sugars like she should.  Recd call from [facility], but didn't schedule for a Diagnostic CGM since last time * Since last time, had an infected pump site on the left thigh, for which she is completing a course of Keflex, site looks much better and not draining anymore *also requests her CBC and iron levels to be taken today on behalf on her PCP- the patient has anemia and has out off GI screening till Summer and would like them checked to see if any improvement. Not having cravings anymore for salty foods, so she thinks its better  Patient is currently on  regimen with  - Novolog in the pump.   her most recent  A1cs were recorded at  Lab Results  Component Value Date   HGBA1C 11.4* 02/15/2015     Pump settings: - basal rates: 12 am: 1.1 units/h 5am: 1.4 units/h 7am:1.1 units/hr TDD from basal insulin: 27 units - ICR: 10 - target:120 - ISF:50 - Insulin on Board:4 hours - bolus wizard: on TDD from bolus insulin: ~16 units ( 39%) - extended bolusing: not using - changes infusion site: q 2-3 days   Patient checks her sugars <1-3  times daily with a Contour next glucometer. By recall/meter download/review her sugars are-  * getting better with FS checks lately, though some days, she just doesn't check  *was starting to see some normal sugars when using her boluses *overall on the pump download -  there are 10 readings in the past 2 weeks  PREMEAL Breakfast Lunch Dinner Bedtime Overall  Glucose range:     88-352  Mean/median:        POST-MEAL PC Breakfast PC Lunch PC Dinner  Glucose range:     Mean/median:       Hypoglycemia-  Still reports symptoms of mild lows in the mornings- but she doesn't check and treats reflexively.  Was having some lows post lunch due to over-correction- didn't check her sugar then. Lowest sugar was n/a; she has hypoglycemia awareness at 100s range. No previous hypoglycemia admission.  Hyperglycemia-  No previous DKA admissions recently.    Dietary Habits- Eats three times daily. Does Carbohydrate counting, but since food choices are limited- now she is back to measuring her foods and counting carbs. Trying to make healthier choices. Decreased cravings for PB and pretzels after starting iron tabs  BF- PB pretzels, yogurt,  lunch - chicken baked, veggies, fruit ( consistently eats at cafeteria at school) Supper-  Has 2 young boys at home- meals catered to their choice, mac cheese, pasta 8 glasses  diet sodas daily Not done well with this either since last visit Exercise- used to walk, but chest palps, stress test done recently last week>>reports that it was normal, requests iron levels to be checked Weight- Weight has been stable recently.  Wt Readings from Last 3 Encounters:  05/23/15 160 lb 9.6 oz (72.848 kg)  03/29/15 165 lb (74.844  kg)  03/07/15 160 lb 8 oz (72.802 kg)    Diabetes Complications- No known complications Nephropathy-- No  CKD, last BUN/creatinine-GFR 92 Lab Results  Component Value Date   BUN 13 02/15/2015   CREATININE 0.7 02/15/2015   Negative urine MA 01/2015  Retinopathy- Yes Last DEE was in Feb 2016 , s/p laser treatments bilaterally Neuropathy- some numbness and tingling in her feet/hands. Associated history - No history of CAD or prior stroke. No Hypertension. Has hypothyroidism since age 55 years.  her last TSH was   Lab Results  Component Value Date   TSH 5.47 02/15/2015  normal TSH 0.34-5.6. She is on Synthroid 125 mcg daily. Now using pill box Taking iron lately later in day due to low HB and not with Synthroid. Not missed any tablets recently. ROS as below- denies cold intolerance, constipation, weight gain Remote hx of over 15 years ago of serial [place] monitoring and FNA ( benign per her report)    No hyperlipidemia. her last set of lipids were-  Lab Results  Component Value Date   CHOL 124 02/15/2015   HDL 57 02/15/2015   LDLCALC 56 02/15/2015   TRIG 55 02/15/2015   I have reviewed the patient's past medical history,  medications and allergies.    Current Outpatient Prescriptions on File Prior to Visit  Medication Sig Dispense Refill  . cetirizine (ZYRTEC) 10 MG tablet Take 10 mg by mouth daily.    Marland Kitchen CRANBERRY CONCENTRATE PO Take 1 capsule by mouth daily.    Marland Kitchen docusate sodium (COLACE) 100 MG capsule Take 100 mg by mouth daily.    . ferrous sulfate 325 (65 FE) MG tablet Take 325 mg by mouth 2 (two) times daily with a meal.    . fluticasone (FLONASE) 50 MCG/ACT nasal spray Spray 2 spray into both nostrils once a day NEEDS TO EST WITH NEW PCP AND NEW ENDO MD    . insulin aspart (NOVOLOG) 100 UNIT/ML injection Use via pump max 60 units per day. 10 mL 6  . levothyroxine (SYNTHROID, LEVOTHROID) 125 MCG tablet take 1 tablet by mouth once daily    . Probiotic Product (PROBIOTIC DAILY PO) Take 1 capsule by mouth daily.     No current facility-administered medications on file prior to visit.   Allergies  Allergen Reactions  . Codeine Itching    Review of Systems- [ x ]  Complains of    [  ]  denies [  ] Recent weight change [  ]  Fatigue [  ] polydipsia [  ] polyuria [  ]  nocturia [  ]  vision difficulty [ x ] chest pain-not current [  ] shortness of breath [  ] leg swelling [  ] cough [  ] nausea/vomiting [  ] diarrhea [  ] constipation [  ] abdominal pain [  ]  tingling/numbness in  extremities [  ]  concern with feet ( wounds/sores)   PHYSICAL EXAM- BP 120/68 mmHg  Pulse 66  Resp 12  Ht 5\' 5"  (1.651 m)  Wt 160 lb 9.6 oz (72.848 kg)  BMI 26.73 kg/m2  SpO2 98% Wt Readings from Last 3 Encounters:  05/23/15 160 lb 9.6 oz (72.848 kg)  03/29/15 165 lb (74.844 kg)  03/07/15 160 lb 8 oz (72.802 kg)   HEENT: [employer]/AT, EOMI, no icterus, no proptosis, no chemosis, no mild lid lag, no retraction, eyes close completely Neck: thyroid gland - smooth, non-tender, no erythema, no tracheal deviation; negative  Pemberton's sign; no lymphadenopathy; no bruits Lungs: good air entry, clear bilaterally Heart: S1&S2 normal, regular rate & rhythm; no murmurs, rubs or gallops Ext: no tremor in hands bilaterally, no edema, 2+ DP/PT pulses, good muscle mass Neuro: normal gait, 2+ reflexes bilaterally, normal 5/5 strength, no proximal myopathy  Derm: no pretibial myxoedema/skin dryness   ASSESSMENT/PLAN- Problem List Items Addressed This Visit      Endocrine   Hypothyroidism    Appears to be clinically euthyroid at this time. Last TSH at higher end of normal- she probably would need a higher dose of levothyroxine. The patient has had prior compliance issues with taking her levothyroxine. Now more compliant.  Recheck today and will adjust dose based on those readings.         Relevant Orders   Comprehensive metabolic panel   Hemoglobin A1c   Microalbumin / creatinine urine ratio   TSH   T4, free   CBC   IBC panel   Ambulatory referral to Endocrinology     Other   Type 1 diabetes, uncontrolled, with retinopathy - Primary    Will update her A1c as she is due for a check. She really hasnt been compliant with Fs checks and using the bolus feature on the pump like she should.  She hasnt had time to do the diagnostic CGM testing done to help US guide in making pump changes.  Now that I am leaving the practice, she is willing to set this testing up with her new provider and requests  referral to be made for [facility] ( which I have placed).  Asked her to concentrate on bolus calculations, carb counting and measuring foods, and using premeal sugars for carb calculations. Hopefully with her being able to see her sugar trends after the diagnostic CGM testing-she will use her pump more for bolusing. Seeing her trends might encourage her to check her sugars more after that.   Will continue current insulin pump settings for now. If she were to start developing low sugars after using the bolus wizard, then she is aware to let me know for interim pump adjustments.               Relevant Orders   Comprehensive metabolic panel   Hemoglobin A1c   Microalbumin / creatinine urine ratio   TSH   T4, free   CBC   IBC panel   Ambulatory referral to Endocrinology   Anemia    Test CBC, iron studies at her request and fwd results to PCP for review.         Relevant Orders   Comprehensive metabolic panel   Hemoglobin A1c   Microalbumin / creatinine urine ratio   TSH   T4, free   CBC   IBC panel   Ambulatory referral to Endocrinology        - Return to clinic in 6 weeks with meter and pump Explained that I am transferring out of state, and she has elected to follow back with [place] clinic. Would like to follow back with the new provider starting there this Summer.  Referral placed. She was also asked to follow up with her PCP this week to see if further treatment is warranted for her recent site infection and follow up on her iron levels.       Jasdeep Dejarnett PUSHKAR 05/23/2015, 2:01 PM

## 2015-05-23 NOTE — Assessment & Plan Note (Signed)
Test CBC, iron studies at her request and fwd results to PCP for review.

## 2015-05-23 NOTE — Progress Notes (Signed)
Pre visit review using our clinic review tool, if applicable. No additional management support is needed unless otherwise documented below in the visit note. 

## 2015-05-23 NOTE — Assessment & Plan Note (Signed)
Appears to be clinically euthyroid at this time. Last TSH at higher end of normal- she probably would need a higher dose of levothyroxine. The patient has had prior compliance issues with taking her levothyroxine. Now more compliant.  Recheck today and will adjust dose based on those readings.

## 2015-05-25 MED ORDER — LEVOTHYROXINE SODIUM 137 MCG PO TABS: 137.0000 ug | ORAL_TABLET | Freq: Every day | ORAL | Status: DC

## 2015-05-31 ENCOUNTER — Encounter: Payer: Self-pay | Admitting: *Deleted

## 2015-12-07 ENCOUNTER — Other Ambulatory Visit: Payer: Self-pay | Admitting: Physician Assistant

## 2015-12-07 ENCOUNTER — Ambulatory Visit
Admission: RE | Admit: 2015-12-07 | Discharge: 2015-12-07 | Disposition: A | Discharge status: Home / Self Care | Payer: BC Managed Care – PPO | Source: Ambulatory Visit | Attending: Physician Assistant | Admitting: Physician Assistant

## 2015-12-07 DIAGNOSIS — M25571 Pain in right ankle and joints of right foot: Secondary | ICD-10-CM | POA: Diagnosis present

## 2015-12-07 DIAGNOSIS — M79604 Pain in right leg: Secondary | ICD-10-CM | POA: Insufficient documentation

## 2015-12-07 DIAGNOSIS — R600 Localized edema: Secondary | ICD-10-CM

## 2017-08-20 DIAGNOSIS — F909 Attention-deficit hyperactivity disorder, unspecified type: Secondary | ICD-10-CM | POA: Insufficient documentation

## 2019-07-20 DIAGNOSIS — E042 Nontoxic multinodular goiter: Secondary | ICD-10-CM | POA: Insufficient documentation

## 2020-04-06 ENCOUNTER — Encounter: Payer: BC Managed Care – PPO | Attending: Physician Assistant | Admitting: Dietician

## 2020-04-06 ENCOUNTER — Other Ambulatory Visit: Payer: Self-pay

## 2020-04-06 ENCOUNTER — Encounter: Payer: Self-pay | Admitting: Dietician

## 2020-04-06 VITALS — Ht 65.0 in | Wt 178.7 lb

## 2020-04-06 DIAGNOSIS — E103593 Type 1 diabetes mellitus with proliferative diabetic retinopathy without macular edema, bilateral: Secondary | ICD-10-CM

## 2020-04-06 NOTE — Progress Notes (Signed)
Medical Nutrition Therapy: Visit start time: 1630  end time: 1730  Assessment:  Diagnosis: Type 1 diabetes Past medical history: hashimoto's thyroiditis Psychosocial issues/ stress concerns: none  Preferred learning method:  . Auditory . Visual . Hands-on  Current weight: 178.7lbs Height: 5'5" Medications, supplements: reconciled list in medical record  Progress and evaluation:   Patient reports long history of practicing diet control for her diabetes (diagnosed 37 years ago), and knows basics of meal planning and carb counting.   She feels she at times might overeat in effort to prevent hypoglycemia, including late night snacking. She thinks she might be "chasing" BGs rather than preventing highs and lows.   Exercise leads to low BGs easily.   Feels fatigued/ sleepy and tends to eat to stay awake. Feels hungry often.   Tried phentermine and lost weight felt better, but regained when she stopped it.   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  Dietary Intake:  Usual eating pattern includes 3 meals and 3 snacks per day. Dining out frequency: 5 meals per week.  Breakfast: cheese stick, occ also low sugar yogurt (Greek) + coffee with sugar free creamer Snack: sometimes soup ie chicken noodle, creamy tomato; cheez-its crackers or cheetos; fruit less often recently Lunch: 12pm-- sandwich; mini ravioli can pasta; tuna salad on crackers; quick, simple meal; out about 2x a week -- cheese dog and tater tots; cutting board grilled chicken sandwich or platter with salad (potato salad, chips); lasagna and salad -- usually not large portions Snack: same as am Supper: when home-- chicken, grilled steak; taco; spaghetti; sloppy joes; hamburger steaks; fish sticks + rice/ pasta/ potato + veggies; not much bread -- quick due to busy schedule Snack: was eating hot cheetos but not recently; baked cheetos or chips  (stopped recently); oodles of noodles with 1/2 of the noodles Beverages: water, diet soda  Nutrition Care Education: Topics covered:     Weight control: determining reasonable weight loss rate, appropriate food portions, estimated energy needs for weight loss at 1300-1400kcal, provided guidance for 50% CHO, 20% protein, and 30% fat Diabetes: appropriate carb intake and balance, healthy carb choices, role of fiber, protein, fat Other:  benefits of choosing unrefined whole foods vs more processed versions and potential for better weight control, reduced fatigue  Nutritional Diagnosis:  [place]-2.2 Altered nutrition-related laboratory As related to Type 1 Diabetes.  As evidenced by patient with recent HbA1C of 9.6%. [place]-3.3 Overweight/obesity As related to inadequate phsical activity, hypothyroidism, excess calories.  As evidenced by patient with current BMI of 29.  Intervention:   Instruction and discussion as noted above.  Patient voices readiness to make diet and lifestyle improvements to aid in BG control and weight control.  Established goals for change with input from patient.   Education Materials given:  . Plate Planner with food lists, sample meal pattern . Sample menus . Goals/ instructions   Learner/ who was taught:  . Patient   Level of understanding: Marland Kitchen Verbalizes/ demonstrates competency   Demonstrated degree of understanding via:   Teach back Learning barriers: . None  Willingness to learn/ readiness for change: . Acceptance, ready for change  Monitoring and Evaluation:  Dietary intake, exercise, BG control, and body weight      follow up: 05/11/20 at 3:30pm

## 2020-04-06 NOTE — Patient Instructions (Signed)
   Aim for 30-45g carb with meals; 15-30g for snacks.   Consider choosing more unprocessed foods like fruits, nuts, veggies, whole grains, and water/ flavored water or sparkling waters for drinks. Less additives in foods might help with Bg control and weight control.  Eat a light snack with 15grams of carb prior to exercise, and can try 7-10g protein with it if desired.

## 2020-05-11 ENCOUNTER — Encounter: Payer: BC Managed Care – PPO | Attending: Physician Assistant | Admitting: Dietician

## 2020-05-11 ENCOUNTER — Other Ambulatory Visit: Payer: Self-pay

## 2020-05-11 VITALS — Ht 65.0 in | Wt 178.6 lb

## 2020-05-11 DIAGNOSIS — E103593 Type 1 diabetes mellitus with proliferative diabetic retinopathy without macular edema, bilateral: Secondary | ICD-10-CM | POA: Diagnosis not present

## 2020-05-11 NOTE — Progress Notes (Signed)
Medical Nutrition Therapy: Visit start time: 1550  end time: 1630  Assessment:  Diagnosis: Type 1 DM Medical history changes: no changes Psychosocial issues/ stress concerns: none  Current weight: 178.6lbs  Height: 5'5" Medications, supplement changes: no changes  Progress and evaluation:  . 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  . She has been eating a light snack prior to exercise. . Weight has been stable since previous visit on 04/06/20. . 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  Dietary Intake:  Usual eating pattern includes 3 meals and 3 snacks per day. Dining out frequency: 5 meals per week.  Breakfast: cheese stick; yogurt; coffee with sugar free creamer Snack: same as pm Lunch: mini ravioli canned pasta; Oodles of Noodles; tuna salad and crackers Snack: cheezits crackers, Oodles of Noodles (portion of container) Supper: meat + veg + potato/pasta/rice; limits bread Snack: same as pm Beverages: water, diet soda  Nutrition Care Education: Topics covered:  Basic nutrition: appropriate meal and snack schedule, general nutrition guidelines    Weight control:  tracking food intake; following structured eating schedule to avoid excess snacking Diabetes: healthy carb choices for lower glycemic response, tracking BGs, bolus-ing times and effects on BGs  Nutritional Diagnosis:  [place]-2.2 Altered nutrition-related laboratory As related to Type 1 Diabetes.  As evidenced by patient with recent HbA1C of 9.6%. [place]-3.3 Overweight/obesity As related to excess calories, inadequate physical activity, hypothyroidism.  As evidenced by patient with current BMI of 29.  Intervention:  . Reviewed  progress since previous visit.  Marland Kitchen Discussion and instruction as noted above. Marland Kitchen Updated goals with direction from patient.  . 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  Education Materials given:  Marland Kitchen Goals/ instructions   Learner/ who was taught:  . Patient   Level of understanding: Marland Kitchen Verbalizes/ demonstrates competency   Demonstrated degree of understanding via:   Teach back Learning barriers: . None  Willingness to learn/ readiness for change: . Eager, gradual change in progress   Monitoring and Evaluation:  Dietary intake, exercise, BG control, and body weight      follow up: to be scheduled later

## 2020-05-11 NOTE — Patient Instructions (Signed)
   Check into apps for tracking food, exercise, and maybe blood sugars also.   Keep working to increase daily movement or structured exercise.  Plan to eat something every 3-5 hours during the day -- wait at least 3 hours between eating times.  Pre-portion snacks and put large containers away.

## 2020-06-26 ENCOUNTER — Ambulatory Visit
Admission: RE | Admit: 2020-06-26 | Discharge: 2020-06-26 | Disposition: A | Discharge status: Home / Self Care | Payer: BC Managed Care – PPO | Source: Ambulatory Visit | Attending: Podiatry | Admitting: Podiatry

## 2020-06-26 ENCOUNTER — Other Ambulatory Visit: Payer: Self-pay

## 2020-06-26 ENCOUNTER — Other Ambulatory Visit: Payer: Self-pay | Admitting: Podiatry

## 2020-06-26 DIAGNOSIS — R600 Localized edema: Secondary | ICD-10-CM

## 2020-06-26 DIAGNOSIS — M25473 Effusion, unspecified ankle: Secondary | ICD-10-CM

## 2020-06-27 ENCOUNTER — Other Ambulatory Visit: Payer: Self-pay | Admitting: Physician Assistant

## 2020-06-27 DIAGNOSIS — Z1231 Encounter for screening mammogram for malignant neoplasm of breast: Secondary | ICD-10-CM

## 2020-07-14 ENCOUNTER — Ambulatory Visit
Admission: RE | Admit: 2020-07-14 | Discharge: 2020-07-14 | Disposition: A | Discharge status: Home / Self Care | Payer: BC Managed Care – PPO | Source: Ambulatory Visit | Attending: Physician Assistant | Admitting: Physician Assistant

## 2020-07-14 ENCOUNTER — Other Ambulatory Visit: Payer: Self-pay

## 2020-07-14 DIAGNOSIS — Z1231 Encounter for screening mammogram for malignant neoplasm of breast: Secondary | ICD-10-CM | POA: Diagnosis present

## 2021-07-10 ENCOUNTER — Other Ambulatory Visit: Payer: Self-pay | Admitting: Physician Assistant

## 2021-07-10 DIAGNOSIS — Z1231 Encounter for screening mammogram for malignant neoplasm of breast: Secondary | ICD-10-CM

## 2021-08-01 ENCOUNTER — Ambulatory Visit
Admission: RE | Admit: 2021-08-01 | Discharge: 2021-08-01 | Disposition: A | Discharge status: Home / Self Care | Payer: BC Managed Care – PPO | Source: Ambulatory Visit | Attending: Physician Assistant | Admitting: Physician Assistant

## 2021-08-01 ENCOUNTER — Other Ambulatory Visit: Payer: Self-pay

## 2021-08-01 DIAGNOSIS — Z1231 Encounter for screening mammogram for malignant neoplasm of breast: Secondary | ICD-10-CM | POA: Diagnosis present

## 2021-08-08 ENCOUNTER — Other Ambulatory Visit: Payer: Self-pay | Admitting: Physician Assistant

## 2021-08-08 DIAGNOSIS — R928 Other abnormal and inconclusive findings on diagnostic imaging of breast: Secondary | ICD-10-CM

## 2021-08-08 DIAGNOSIS — N6489 Other specified disorders of breast: Secondary | ICD-10-CM

## 2021-08-10 ENCOUNTER — Other Ambulatory Visit: Payer: Self-pay

## 2021-08-10 ENCOUNTER — Ambulatory Visit
Admission: RE | Admit: 2021-08-10 | Discharge: 2021-08-10 | Disposition: A | Discharge status: Home / Self Care | Payer: BC Managed Care – PPO | Source: Ambulatory Visit | Attending: Physician Assistant | Admitting: Physician Assistant

## 2021-08-10 DIAGNOSIS — N6489 Other specified disorders of breast: Secondary | ICD-10-CM

## 2021-08-10 DIAGNOSIS — R928 Other abnormal and inconclusive findings on diagnostic imaging of breast: Secondary | ICD-10-CM

## 2021-08-24 ENCOUNTER — Other Ambulatory Visit (HOSPITAL_COMMUNITY): Payer: Self-pay | Admitting: Podiatry

## 2021-08-24 ENCOUNTER — Other Ambulatory Visit: Payer: Self-pay | Admitting: Podiatry

## 2021-08-24 DIAGNOSIS — M79671 Pain in right foot: Secondary | ICD-10-CM

## 2021-08-24 DIAGNOSIS — M7671 Peroneal tendinitis, right leg: Secondary | ICD-10-CM

## 2021-09-01 ENCOUNTER — Ambulatory Visit
Admission: RE | Admit: 2021-09-01 | Discharge: 2021-09-01 | Disposition: A | Discharge status: Home / Self Care | Payer: BC Managed Care – PPO | Source: Ambulatory Visit | Attending: Podiatry | Admitting: Podiatry

## 2021-09-01 DIAGNOSIS — M79671 Pain in right foot: Secondary | ICD-10-CM

## 2021-09-01 DIAGNOSIS — M7671 Peroneal tendinitis, right leg: Secondary | ICD-10-CM | POA: Diagnosis present

## 2021-10-29 ENCOUNTER — Other Ambulatory Visit (INDEPENDENT_AMBULATORY_CARE_PROVIDER_SITE_OTHER): Payer: Self-pay | Admitting: Nurse Practitioner

## 2021-10-29 DIAGNOSIS — L819 Disorder of pigmentation, unspecified: Secondary | ICD-10-CM

## 2021-10-29 DIAGNOSIS — M7989 Other specified soft tissue disorders: Secondary | ICD-10-CM

## 2021-10-30 ENCOUNTER — Ambulatory Visit (INDEPENDENT_AMBULATORY_CARE_PROVIDER_SITE_OTHER): Payer: BC Managed Care – PPO | Admitting: Nurse Practitioner

## 2021-10-30 ENCOUNTER — Ambulatory Visit (INDEPENDENT_AMBULATORY_CARE_PROVIDER_SITE_OTHER): Payer: BC Managed Care – PPO

## 2021-10-30 ENCOUNTER — Other Ambulatory Visit: Payer: Self-pay

## 2021-10-30 ENCOUNTER — Encounter (INDEPENDENT_AMBULATORY_CARE_PROVIDER_SITE_OTHER): Payer: Self-pay | Admitting: Nurse Practitioner

## 2021-10-30 VITALS — BP 119/72 | HR 81 | Ht 65.0 in | Wt 175.0 lb

## 2021-10-30 DIAGNOSIS — M79604 Pain in right leg: Secondary | ICD-10-CM

## 2021-10-30 DIAGNOSIS — E785 Hyperlipidemia, unspecified: Secondary | ICD-10-CM | POA: Insufficient documentation

## 2021-10-30 DIAGNOSIS — L819 Disorder of pigmentation, unspecified: Secondary | ICD-10-CM | POA: Diagnosis not present

## 2021-10-30 DIAGNOSIS — M7989 Other specified soft tissue disorders: Secondary | ICD-10-CM

## 2021-10-30 DIAGNOSIS — E038 Other specified hypothyroidism: Secondary | ICD-10-CM | POA: Insufficient documentation

## 2021-11-04 ENCOUNTER — Encounter (INDEPENDENT_AMBULATORY_CARE_PROVIDER_SITE_OTHER): Payer: Self-pay | Admitting: Nurse Practitioner

## 2021-11-04 NOTE — Progress Notes (Signed)
Subjective:    Patient ID: Melissa Gross, female    DOB: 1971/11/05, 50 y.o.   MRN: [id number] Chief Complaint  Patient presents with   New Patient (Initial Visit)    Np+abi+consult RLE swelling and discoloration. Referred by potter , Melissa Gross is a 50 year old female that presents today as a referral from Dr. Melrose Gross in regards to right lower extremity swelling and discoloration.  Approximately a couple of years ago her right lower extremity began to have swelling.  It was on the right leg none in the left.  She had DVT studies done in May been which were negative.  This summer the pain began on the right side of the foot which led to a podiatry referral.  X-ray showed no broken bone and an MRI revealed a possible ligament tear.  Podiatry believes that it may be related to some of her ankle rolling.  The patient also began to have numbness in that lower extremity which led to neurology referral.  Cervical MRI shows some mild stenosis.  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  She denies claudication-like symptoms or rest pain.  There are no open wounds or ulcerations.  Today noninvasive studies showed an ABI of 1.18 on the right and 1.0 on the left.  Normal TBI's bilaterally.  Triphasic tibial artery waveforms bilaterally with good toe waveforms bilaterally.   Review of Systems  Cardiovascular:  Positive for leg swelling.  All other systems reviewed and are negative.     Objective:   Physical Exam Vitals reviewed.  HENT:     Head: Normocephalic.  Cardiovascular:     Rate and Rhythm: Normal rate.     Pulses:          Dorsalis pedis pulses are 2+ on the right side and 2+ on the left side.       Posterior tibial pulses are 2+ on the right side and 2+ on the left side.  Pulmonary:     Effort: Pulmonary effort is normal.  Musculoskeletal:     Right lower leg: 1+ Edema present.  Skin:    General:  Skin is warm and dry.  Neurological:     Mental Status: She is alert and oriented to person, place, and time.  Psychiatric:        Mood and Affect: Mood normal.        Behavior: Behavior normal.        Thought Content: Thought content normal.        Judgment: Judgment normal.    BP 119/72   Pulse 81   Ht 5\' 5"  (1.651 m)   Wt 175 lb (79.4 kg)   BMI 29.12 kg/m   Past Medical History:  Diagnosis Date   Allergy    Diabetes mellitus without complication ([place])    Thyroid disease     Social History   Socioeconomic History   Marital status: Married    Spouse name: Not on file   Number of children: Not on file   Years of education: Not on file   Highest education level: Not on file  Occupational History   Not on file  Tobacco Use   Smoking status: Former   Smokeless tobacco: Never  Substance and Sexual Activity   Alcohol use: No    Alcohol/week: 0.0 standard drinks   Drug use: No   Sexual activity: Not on file  Other Topics Concern   Not on file  Social History Narrative   Not on file   Social Determinants of Health   Financial Resource Strain: Not on file  Food Insecurity: Not on file  Transportation Needs: Not on file  Physical Activity: Not on file  Stress: Not on file  Social Connections: Not on file  Intimate Partner Violence: Not on file    Past Surgical History:  Procedure Laterality Date   CESAREAN SECTION     FRACTURE SURGERY      Family History  Problem Relation Age of Onset   Diabetes Other     Allergies  Allergen Reactions   Codeine Itching   Nitrofurantoin Hives and Itching   Ranitidine Itching    CBC Latest Ref Rng & Units 05/23/2015 02/15/2015 12/16/2012  WBC 4.0 - 10.5 K/uL 5.3 7.2 -  Hemoglobin 12.0 - 15.0 g/dL 14.6 8.0(A) 13.1  Hematocrit 36.0 - 46.0 % 44.3 29(A) -  Platelets 150.0 - 400.0 K/uL 303.0 296 -      CMP     Component Value Date/Time   NA 134 (L) 05/23/2015 1158   NA 133 (A) 02/15/2015 0000   NA 138 12/16/2012  0535   K 4.5 05/23/2015 1158   K 4.2 12/16/2012 0535   CL 102 05/23/2015 1158   CL 106 12/16/2012 0535   CO2 26 05/23/2015 1158   CO2 26 12/16/2012 0535   GLUCOSE 199 (H) 05/23/2015 1158   GLUCOSE 92 12/16/2012 0535   BUN 10 05/23/2015 1158   BUN 13 02/15/2015 0000   BUN 5 (L) 12/16/2012 0535   CREATININE 0.68 05/23/2015 1158   CREATININE 0.55 (L) 12/16/2012 0535   CALCIUM 9.4 05/23/2015 1158   CALCIUM 8.6 12/16/2012 0535   PROT 6.6 05/23/2015 1158   PROT 7.7 12/13/2012 1612   ALBUMIN 4.2 05/23/2015 1158   ALBUMIN 4.4 12/13/2012 1612   AST 13 05/23/2015 1158   AST 19 12/13/2012 1612   ALT 6 05/23/2015 1158   ALT 16 12/13/2012 1612   ALKPHOS 65 05/23/2015 1158   ALKPHOS 95 12/13/2012 1612   BILITOT 0.5 05/23/2015 1158   BILITOT 0.5 12/13/2012 1612   GFRNONAA >60 12/16/2012 0535   GFRAA >60 12/16/2012 0535     VAS [place] ABI WITH/WO TBI  Result Date: 10/31/2021  LOWER EXTREMITY DOPPLER STUDY Patient Name:  Melissa Gross  Date of Exam:   10/30/2021 Medical Rec #: [id number]          Accession #:    [id number] Date of Birth: 01-18-1971           Patient Gender: F Patient Age:   51 years Exam Location:  [facility] Procedure:      VAS [place] ABI WITH/WO TBI Referring Phys: Leotis Pain --------------------------------------------------------------------------------  Indications: RT LE Discoloration.  Performing Technologist: Almira Coaster RVS  Examination Guidelines: A complete evaluation includes at minimum, Doppler waveform signals and systolic blood pressure reading at the level of bilateral brachial, anterior tibial, and posterior tibial arteries, when vessel segments are accessible. Bilateral testing is considered an integral part of a complete examination. Photoelectric Plethysmograph (PPG) waveforms and toe systolic pressure readings are included as required and additional duplex testing as needed. Limited examinations for reoccurring indications may be performed as noted.   ABI Findings: +---------+------------------+-----+---------+--------+ Right    Rt Pressure (mmHg)IndexWaveform Comment  +---------+------------------+-----+---------+--------+ Brachial 134                                      +---------+------------------+-----+---------+--------+  ATA      161                    triphasic1.18     +---------+------------------+-----+---------+--------+ PTA      135               0.99 triphasic         +---------+------------------+-----+---------+--------+ Great Toe172               1.26 Normal            +---------+------------------+-----+---------+--------+ +---------+------------------+-----+---------+-------+ Left     Lt Pressure (mmHg)IndexWaveform Comment +---------+------------------+-----+---------+-------+ Brachial 136                                     +---------+------------------+-----+---------+-------+ ATA      132                    triphasic.97     +---------+------------------+-----+---------+-------+ PTA      136               1.00 triphasic        +---------+------------------+-----+---------+-------+ Great Toe140               1.03 Normal           +---------+------------------+-----+---------+-------+ +-------+-----------+-----------+------------+------------+ ABI/TBIToday's ABIToday's TBIPrevious ABIPrevious TBI +-------+-----------+-----------+------------+------------+ Right  1.18       1.26                                +-------+-----------+-----------+------------+------------+ Left   1.0        1.03                                +-------+-----------+-----------+------------+------------+  Summary: Right: Resting right ankle-brachial index is within normal range. No evidence of significant right lower extremity arterial disease. The right toe-brachial index is normal. Left: Resting left ankle-brachial index is within normal range. No evidence of significant left lower extremity  arterial disease. The left toe-brachial index is normal.  *See table(s) above for measurements and observations.  Electronically signed by Festus Barren MD on 10/31/2021 at 11:17:44 AM.    Final        Assessment & Plan:   1. Right leg pain Noninvasive studies today show the patient does not have any arterial abnormalities however this does not account for possible venous reflux.  We will have the patient return at her convenience for noninvasive studies to evaluate for possible venous reflux as a cause of her lower extremity edema.  Patient should also continue to follow with neurology and podiatry.  Further treatment will be advised based on noninvasive studies.   Current Outpatient Medications on File Prior to Visit  Medication Sig Dispense Refill   albuterol (VENTOLIN HFA) 108 (90 Base) MCG/ACT inhaler Inhale into the lungs.     aspirin 81 MG chewable tablet Chew by mouth.     cetirizine (ZYRTEC) 10 MG tablet Take 10 mg by mouth daily.     CRANBERRY CONCENTRATE PO Take 1 capsule by mouth daily.     D-MANNOSE PO D Mannose  OTC     docusate sodium (COLACE) 100 MG capsule Take 100 mg by mouth daily.     ferrous sulfate 325 (65 FE) MG tablet Take 325 mg by mouth  2 (two) times daily with a meal.     fluticasone (FLONASE) 50 MCG/ACT nasal spray Spray 2 spray into both nostrils once a day NEEDS TO EST WITH NEW PCP AND NEW ENDO MD     insulin aspart (NOVOLOG) 100 UNIT/ML injection Use via pump max 60 units per day. 10 mL 6   lisinopril (ZESTRIL) 5 MG tablet TAKE 1 TABLET BY MOUTH EVERY DAY     meclizine (ANTIVERT) 12.5 MG tablet Take 12.5 mg by mouth 3 (three) times daily as needed.     metroNIDAZOLE (METROGEL) 0.75 % gel Apply topically.     omeprazole (PRILOSEC) 20 MG capsule Take by mouth.     Probiotic Product (PROBIOTIC DAILY PO) Take 1 capsule by mouth daily.     Vitamin D, Ergocalciferol, (DRISDOL) 1.25 MG (50000 UNIT) CAPS capsule Take 50,000 Units by mouth once a week.     levothyroxine  (SYNTHROID) 125 MCG tablet Take by mouth.     methylphenidate (RITALIN) 10 MG tablet Take by mouth.     No current facility-administered medications on file prior to visit.    There are no Patient Instructions on file for this visit. No follow-ups on file.   Kris Hartmann, NP

## 2021-11-27 ENCOUNTER — Ambulatory Visit (INDEPENDENT_AMBULATORY_CARE_PROVIDER_SITE_OTHER): Payer: BC Managed Care – PPO

## 2021-11-27 ENCOUNTER — Encounter (INDEPENDENT_AMBULATORY_CARE_PROVIDER_SITE_OTHER): Payer: Self-pay | Admitting: Nurse Practitioner

## 2021-11-27 ENCOUNTER — Other Ambulatory Visit (INDEPENDENT_AMBULATORY_CARE_PROVIDER_SITE_OTHER): Payer: Self-pay | Admitting: Vascular Surgery

## 2021-11-27 ENCOUNTER — Ambulatory Visit (INDEPENDENT_AMBULATORY_CARE_PROVIDER_SITE_OTHER): Payer: BC Managed Care – PPO | Admitting: Nurse Practitioner

## 2021-11-27 VITALS — BP 120/71 | HR 75 | Resp 16 | Wt 177.0 lb

## 2021-11-27 DIAGNOSIS — M7989 Other specified soft tissue disorders: Secondary | ICD-10-CM | POA: Diagnosis not present

## 2021-11-27 DIAGNOSIS — R600 Localized edema: Secondary | ICD-10-CM

## 2021-11-27 DIAGNOSIS — M79604 Pain in right leg: Secondary | ICD-10-CM | POA: Diagnosis not present

## 2021-12-04 ENCOUNTER — Encounter (INDEPENDENT_AMBULATORY_CARE_PROVIDER_SITE_OTHER): Payer: Self-pay | Admitting: Nurse Practitioner

## 2021-12-04 NOTE — Progress Notes (Signed)
Subjective:    Patient ID: Melissa Gross, female    DOB: 1971/04/13, 51 y.o.   MRN: [id number] Chief Complaint  Patient presents with   Follow-up    Ultrasound follow up    Melissa Gross is a 51 year old female that presents today as a referral from Dr. Melrose Nakayama in regards to right lower extremity swelling and discoloration.  Approximately a couple of years ago her right lower extremity began to have swelling.  It was on the right leg none in the left.  She had DVT studies done in May been which were negative.  This summer the pain began on the right side of the foot which led to a podiatry referral.  X-ray showed no broken bone and an MRI revealed a possible ligament tear.  Podiatry believes that it may be related to some of her ankle rolling.  The patient also began to have numbness in that lower extremity which led to neurology referral.  Cervical MRI shows some mild stenosis.  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  She denies claudication-like symptoms or rest pain.  There are no open wounds or ulcerations.  There is no evidence of DVT or superficial thrombophlebitis in the bilateral lower extremities.  No evidence of deep venous insufficiency or superficial venous reflux seen bilaterally.   Review of Systems  Cardiovascular:  Positive for leg swelling.  All other systems reviewed and are negative.     Objective:   Physical Exam Vitals reviewed.  HENT:     Head: Normocephalic.  Cardiovascular:     Rate and Rhythm: Normal rate.  Pulmonary:     Effort: Pulmonary effort is normal.  Musculoskeletal:     Right lower leg: Edema present.  Skin:    General: Skin is warm and dry.  Neurological:     Mental Status: She is alert and oriented to person, place, and time.  Psychiatric:        Mood and Affect: Mood normal.        Behavior: Behavior normal.        Thought Content: Thought content normal.         Judgment: Judgment normal.    BP 120/71 (BP Location: Left Arm)    Pulse 75    Resp 16    Wt 177 lb (80.3 kg)    BMI 29.45 kg/m   Past Medical History:  Diagnosis Date   Allergy    Diabetes mellitus without complication ([place])    Thyroid disease     Social History   Socioeconomic History   Marital status: Married    Spouse name: Not on file   Number of children: Not on file   Years of education: Not on file   Highest education level: Not on file  Occupational History   Not on file  Tobacco Use   Smoking status: Former   Smokeless tobacco: Never  Substance and Sexual Activity   Alcohol use: No    Alcohol/week: 0.0 standard drinks   Drug use: No   Sexual activity: Not on file  Other Topics Concern   Not on file  Social History Narrative   Not on file   Social Determinants of Health   Financial Resource Strain: Not on file  Food Insecurity: Not on file  Transportation Needs: Not on file  Physical Activity: Not on file  Stress: Not on file  Social Connections: Not on file  Intimate Partner Violence: Not on file    Past Surgical History:  Procedure Laterality Date   CESAREAN SECTION     FRACTURE SURGERY      Family History  Problem Relation Age of Onset   Diabetes Other     Allergies  Allergen Reactions   Codeine Itching   Nitrofurantoin Hives and Itching   Ranitidine Itching    CBC Latest Ref Rng & Units 05/23/2015 02/15/2015 12/16/2012  WBC 4.0 - 10.5 K/uL 5.3 7.2 -  Hemoglobin 12.0 - 15.0 g/dL 14.6 8.0(A) 13.1  Hematocrit 36.0 - 46.0 % 44.3 29(A) -  Platelets 150.0 - 400.0 K/uL 303.0 296 -      CMP     Component Value Date/Time   NA 134 (L) 05/23/2015 1158   NA 133 (A) 02/15/2015 0000   NA 138 12/16/2012 0535   K 4.5 05/23/2015 1158   K 4.2 12/16/2012 0535   CL 102 05/23/2015 1158   CL 106 12/16/2012 0535   CO2 26 05/23/2015 1158   CO2 26 12/16/2012 0535   GLUCOSE 199 (H) 05/23/2015 1158   GLUCOSE 92 12/16/2012 0535   BUN 10 05/23/2015  1158   BUN 13 02/15/2015 0000   BUN 5 (L) 12/16/2012 0535   CREATININE 0.68 05/23/2015 1158   CREATININE 0.55 (L) 12/16/2012 0535   CALCIUM 9.4 05/23/2015 1158   CALCIUM 8.6 12/16/2012 0535   PROT 6.6 05/23/2015 1158   PROT 7.7 12/13/2012 1612   ALBUMIN 4.2 05/23/2015 1158   ALBUMIN 4.4 12/13/2012 1612   AST 13 05/23/2015 1158   AST 19 12/13/2012 1612   ALT 6 05/23/2015 1158   ALT 16 12/13/2012 1612   ALKPHOS 65 05/23/2015 1158   ALKPHOS 95 12/13/2012 1612   BILITOT 0.5 05/23/2015 1158   BILITOT 0.5 12/13/2012 1612   GFRNONAA >60 12/16/2012 0535   GFRAA >60 12/16/2012 0535     VAS [place] ABI WITH/WO TBI  Result Date: 10/31/2021  LOWER EXTREMITY DOPPLER STUDY Patient Name:  JAIMI LAESSIG  Date of Exam:   10/30/2021 Medical Rec #: [id number]          Accession #:    [id number] Date of Birth: 1971/08/11           Patient Gender: F Patient Age:   53 years Exam Location:  [facility] Procedure:      VAS [place] ABI WITH/WO TBI Referring Phys: Leotis Pain --------------------------------------------------------------------------------  Indications: RT LE Discoloration.  Performing Technologist: Almira Coaster RVS  Examination Guidelines: A complete evaluation includes at minimum, Doppler waveform signals and systolic blood pressure reading at the level of bilateral brachial, anterior tibial, and posterior tibial arteries, when vessel segments are accessible. Bilateral testing is considered an integral part of a complete examination. Photoelectric Plethysmograph (PPG) waveforms and toe systolic pressure readings are included as required and additional duplex testing as needed. Limited examinations for reoccurring indications may be performed as noted.  ABI Findings: +---------+------------------+-----+---------+--------+  Right     Rt Pressure (mmHg) Index Waveform  Comment   +---------+------------------+-----+---------+--------+  Brachial  134                                           +---------+------------------+-----+---------+--------+  ATA       161  triphasic 1.18      +---------+------------------+-----+---------+--------+  PTA       135                0.99  triphasic           +---------+------------------+-----+---------+--------+  Great Toe 172                1.26  Normal              +---------+------------------+-----+---------+--------+ +---------+------------------+-----+---------+-------+  Left      Lt Pressure (mmHg) Index Waveform  Comment  +---------+------------------+-----+---------+-------+  Brachial  136                                         +---------+------------------+-----+---------+-------+  ATA       132                      triphasic .97      +---------+------------------+-----+---------+-------+  PTA       136                1.00  triphasic          +---------+------------------+-----+---------+-------+  Great Toe 140                1.03  Normal             +---------+------------------+-----+---------+-------+ +-------+-----------+-----------+------------+------------+  ABI/TBI Today's ABI Today's TBI Previous ABI Previous TBI  +-------+-----------+-----------+------------+------------+  Right   1.18        1.26                                   +-------+-----------+-----------+------------+------------+  Left    1.0         1.03                                   +-------+-----------+-----------+------------+------------+  Summary: Right: Resting right ankle-brachial index is within normal range. No evidence of significant right lower extremity arterial disease. The right toe-brachial index is normal. Left: Resting left ankle-brachial index is within normal range. No evidence of significant left lower extremity arterial disease. The left toe-brachial index is normal.  *See table(s) above for measurements and observations.  Electronically signed by Leotis Pain MD on 10/31/2021 at 11:17:44 AM.    Final        Assessment & Plan:   1. Leg  swelling Today the patient's noninvasive studies did not show any evidence of venous insufficiency or venous reflux.  It is likely that the patient has some lymphedema related to possible muscle strain.  Patient is advised to continue with conservative therapy including use of medical grade compression stockings elevation and activity as she is able to tolerate.  We will plan on having her return in 1 year or sooner if issues arise.  2. Right leg pain The patient does continue to have pain with any significant ambulation on the right foot.  Both arterial and venous studies do not reveal any abnormalities.  Patient will continue to follow with podiatry.   Current Outpatient Medications on File Prior to Visit  Medication Sig Dispense Refill   albuterol (VENTOLIN HFA) 108 (90 Base) MCG/ACT inhaler Inhale into the lungs.     aspirin  81 MG chewable tablet Chew by mouth.     cetirizine (ZYRTEC) 10 MG tablet Take 10 mg by mouth daily.     CRANBERRY CONCENTRATE PO Take 1 capsule by mouth daily.     D-MANNOSE PO D Mannose  OTC     docusate sodium (COLACE) 100 MG capsule Take 100 mg by mouth daily.     ferrous sulfate 325 (65 FE) MG tablet Take 325 mg by mouth 2 (two) times daily with a meal.     fluticasone (FLONASE) 50 MCG/ACT nasal spray Spray 2 spray into both nostrils once a day NEEDS TO EST WITH NEW PCP AND NEW ENDO MD     insulin aspart (NOVOLOG) 100 UNIT/ML injection Use via pump max 60 units per day. 10 mL 6   lisinopril (ZESTRIL) 5 MG tablet TAKE 1 TABLET BY MOUTH EVERY DAY     meclizine (ANTIVERT) 12.5 MG tablet Take 12.5 mg by mouth 3 (three) times daily as needed.     metroNIDAZOLE (METROGEL) 0.75 % gel Apply topically.     omeprazole (PRILOSEC) 20 MG capsule Take by mouth.     pregabalin (LYRICA) 25 MG capsule Take by mouth.     Probiotic Product (PROBIOTIC DAILY PO) Take 1 capsule by mouth daily.     Vitamin D, Ergocalciferol, (DRISDOL) 1.25 MG (50000 UNIT) CAPS capsule Take 50,000  Units by mouth once a week.     levothyroxine (SYNTHROID) 125 MCG tablet Take by mouth.     methylphenidate (RITALIN) 10 MG tablet Take by mouth.     No current facility-administered medications on file prior to visit.    There are no Patient Instructions on file for this visit. No follow-ups on file.   Kris Hartmann, NP

## 2022-05-12 IMAGING — MR MR ANKLE*R* W/O CM
5 series · 40 of 40 positions shown · non-contrast
Comparison: None.

CLINICAL DATA: Ankle injury while walking last [REDACTED]. Patient felt a
popping sensation and has developed increasing pain over the last 2
weeks. Pain is greatest laterally.

EXAM:
MRI OF THE RIGHT ANKLE WITHOUT CONTRAST
TECHNIQUE: Multiplanar, multisequence MR imaging of the ankle was performed. No
intravenous contrast was administered.

[Series 5: PD fat-sat · axial · right · 3.0mm · 0.50mm/px · z∈[-82,+58]mm · 11 of 36 slices shown]
[im 1/36]
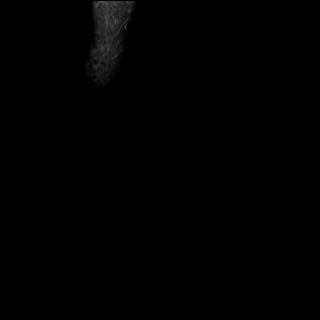
[im 4/36]
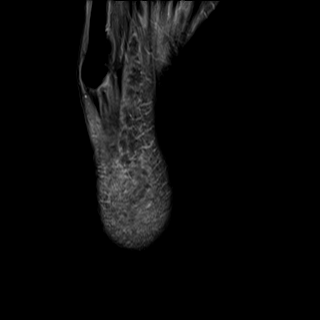
[im 8/36]
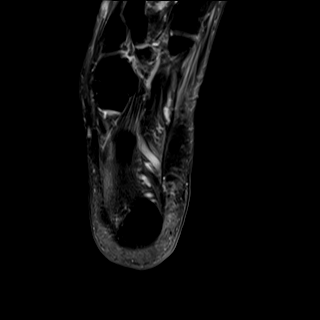
[im 11/36]
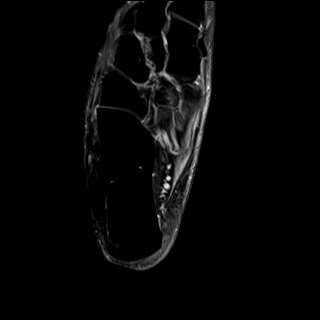
[im 15/36]
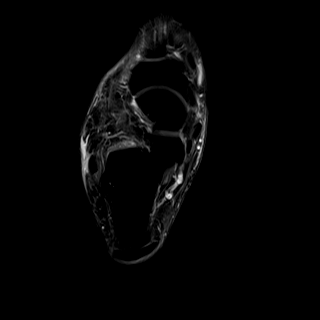
[im 18/36]
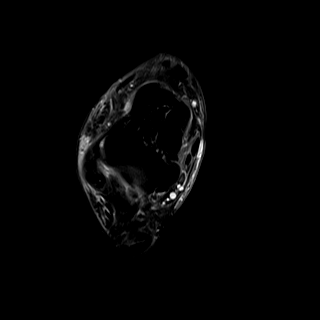
[im 22/36]
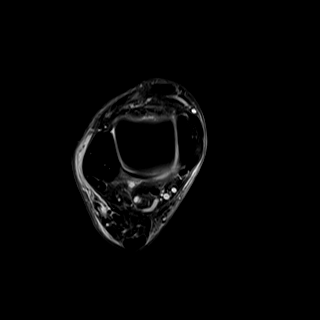
[im 25/36]
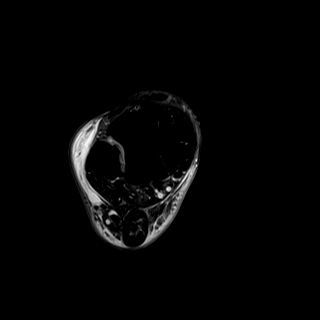
[im 29/36]
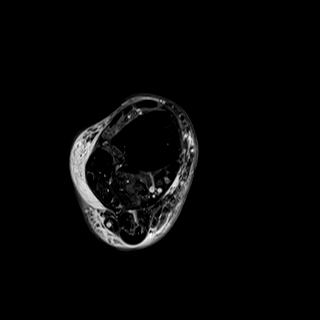
[im 32/36]
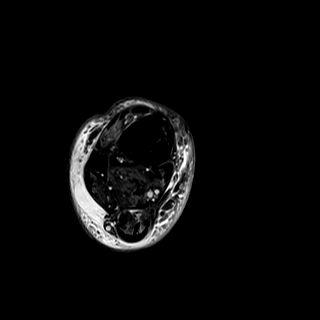
[im 36/36]
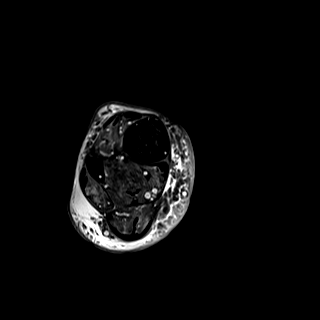

[Series 6: T2 fat-sat · axial · right · 3.0mm · 0.50mm/px · z∈[-82,+58]mm · 10 of 36 slices shown]
[im 1/36]
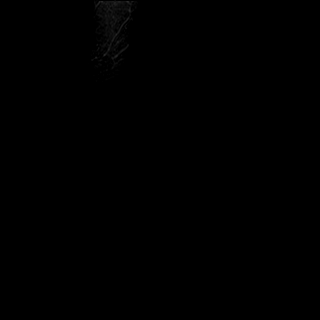
[im 4/36]
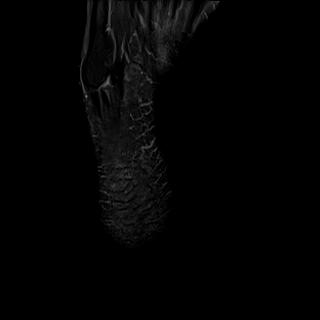
[im 8/36]
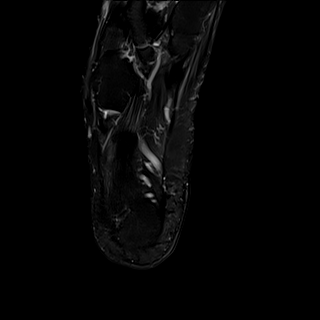
[im 12/36]
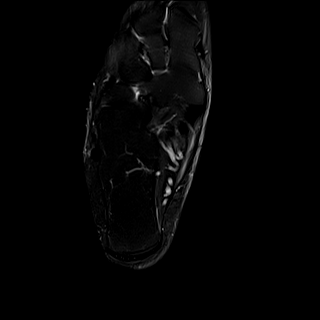
[im 16/36]
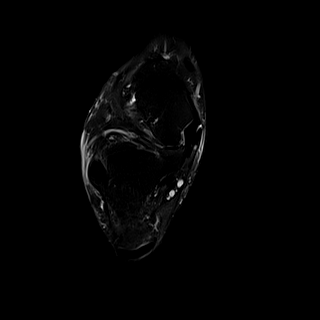
[im 20/36]
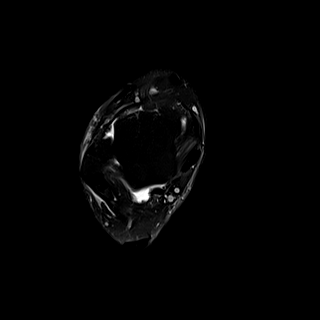
[im 24/36]
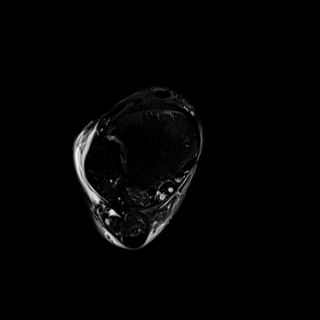
[im 28/36]
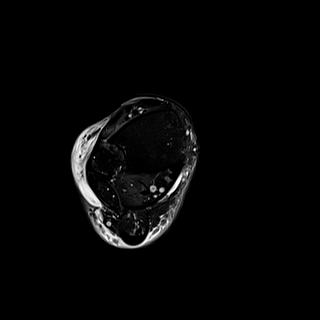
[im 32/36]
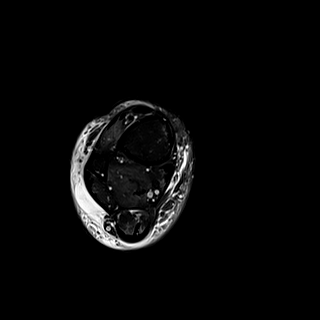
[im 36/36]
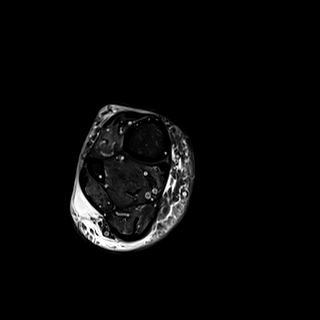

[Series 8: T2 · coronal · right · 3.0mm · 0.62mm/px · 9 of 34 slices shown]
[im 1/34]
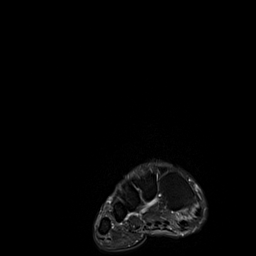
[im 5/34]
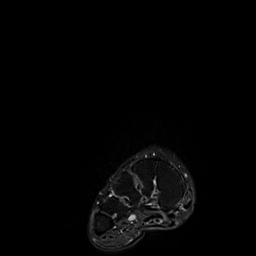
[im 9/34]
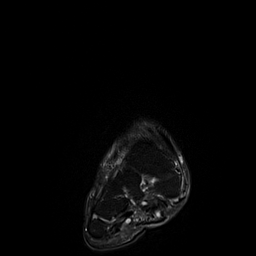
[im 13/34]
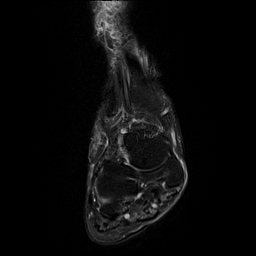
[im 17/34]
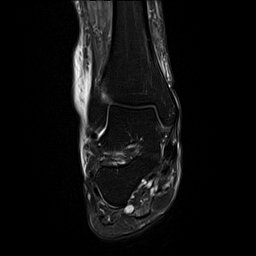
[im 21/34]
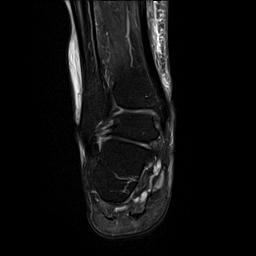
[im 25/34]
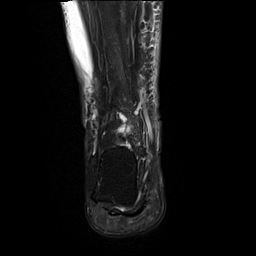
[im 29/34]
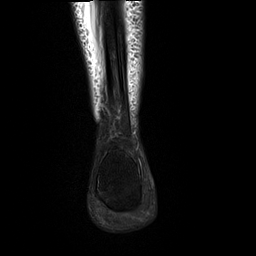
[im 34/34]
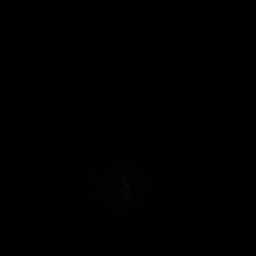

[Series 9: T1 · sagittal · right · 4.0mm · 0.70mm/px · 5 of 20 slices shown]
[im 1/20]
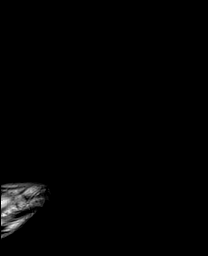
[im 5/20]
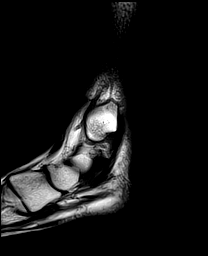
[im 10/20]
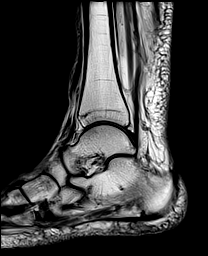
[im 15/20]
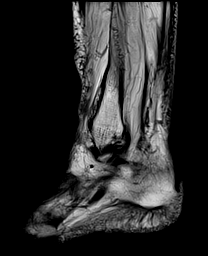
[im 20/20]
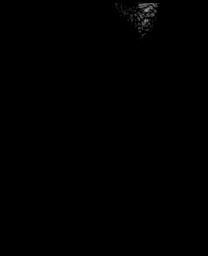

[Series 10: STIR · sagittal · right · 4.0mm · 0.35mm/px · 5 of 20 slices shown]
[im 1/20]
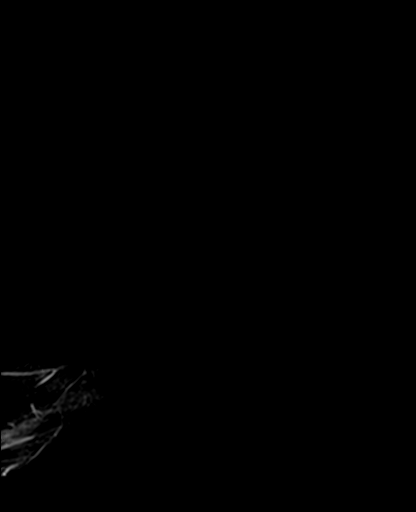
[im 5/20]
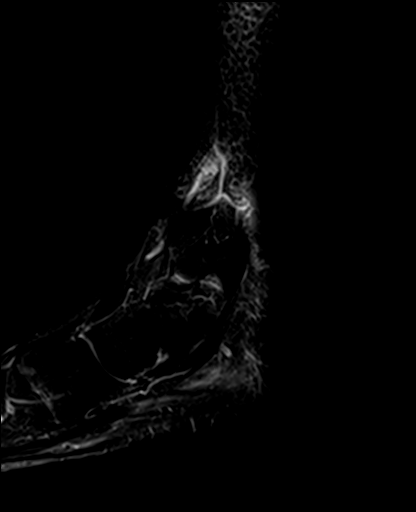
[im 10/20]
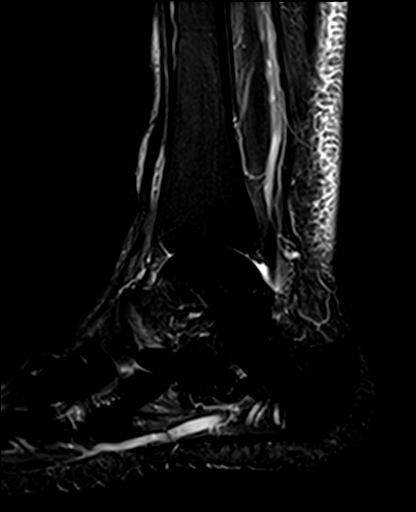
[im 15/20]
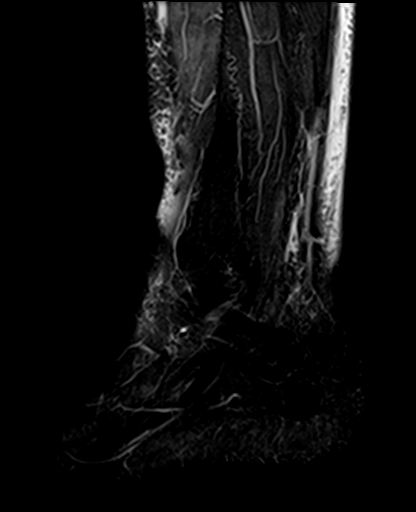
[im 20/20]
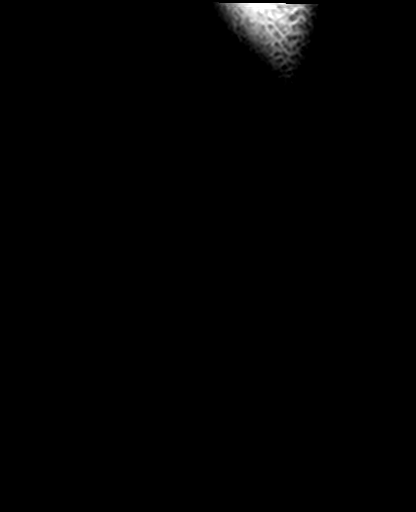

[40 of 40 positions shown; findings below may reference images not displayed]

FINDINGS: TENDONS

Peroneal: The peroneus brevis tendon appears mildly flattened at the
level of the lateral malleolus, although no discrete longitudinal
split tear or tenosynovitis identified. The peroneus longus tendon
appears normal.

Posteromedial: Intact and normally positioned.

Anterior: Intact and normally positioned.

Achilles: Intact.

Plantar Fascia: Intact.

LIGAMENTS

Lateral: The anterior and posterior talofibular and calcaneofibular
ligaments are intact.The inferior tibiofibular ligaments appear
intact.

Medial: The deltoid and visualized portions of the spring ligament
appear intact.

CARTILAGE AND BONES

Ankle Joint: No significant ankle joint effusion. The talar dome and
tibial plafond are intact.

Subtalar Joints/Sinus Tarsi: Unremarkable.

Bones: No significant extra-articular osseous findings.

Other: Generalized subcutaneous edema surrounding the ankle,
greatest laterally. No focal fluid collection or foreign body
identified.
IMPRESSION: 1. Nonspecific generalized subcutaneous edema surrounding the ankle,
greatest laterally.
2. Peroneus brevis tendinosis without evidence of longitudinal split
tear. The additional ankle tendons appear normal.
3. No acute osseous or ligamentous findings identified.

## 2022-09-30 ENCOUNTER — Encounter (INDEPENDENT_AMBULATORY_CARE_PROVIDER_SITE_OTHER): Payer: Self-pay

## 2022-10-10 ENCOUNTER — Other Ambulatory Visit: Payer: Self-pay | Admitting: Physician Assistant

## 2022-10-10 DIAGNOSIS — Z1231 Encounter for screening mammogram for malignant neoplasm of breast: Secondary | ICD-10-CM

## 2022-11-27 ENCOUNTER — Ambulatory Visit (INDEPENDENT_AMBULATORY_CARE_PROVIDER_SITE_OTHER): Payer: BC Managed Care – PPO | Admitting: Nurse Practitioner

## 2023-03-04 ENCOUNTER — Ambulatory Visit
Admission: RE | Admit: 2023-03-04 | Discharge: 2023-03-04 | Disposition: A | Discharge status: Home / Self Care | Payer: BC Managed Care – PPO | Source: Ambulatory Visit | Attending: Physician Assistant | Admitting: Physician Assistant

## 2023-03-04 DIAGNOSIS — Z1231 Encounter for screening mammogram for malignant neoplasm of breast: Secondary | ICD-10-CM | POA: Diagnosis present

## 2023-03-17 ENCOUNTER — Other Ambulatory Visit: Payer: Self-pay | Admitting: Family Medicine

## 2023-03-17 DIAGNOSIS — G959 Disease of spinal cord, unspecified: Secondary | ICD-10-CM

## 2023-03-18 ENCOUNTER — Ambulatory Visit: Payer: BC Managed Care – PPO

## 2023-03-20 ENCOUNTER — Other Ambulatory Visit: Payer: Self-pay | Admitting: Physician Assistant

## 2023-03-20 DIAGNOSIS — K7689 Other specified diseases of liver: Secondary | ICD-10-CM

## 2023-03-24 ENCOUNTER — Ambulatory Visit
Admission: RE | Admit: 2023-03-24 | Discharge: 2023-03-24 | Disposition: A | Discharge status: Home / Self Care | Payer: BC Managed Care – PPO | Source: Ambulatory Visit | Attending: Physician Assistant | Admitting: Physician Assistant

## 2023-03-24 DIAGNOSIS — K7689 Other specified diseases of liver: Secondary | ICD-10-CM | POA: Diagnosis present

## 2023-04-07 ENCOUNTER — Other Ambulatory Visit: Payer: Self-pay | Admitting: Physician Assistant

## 2023-04-07 ENCOUNTER — Encounter: Payer: Self-pay | Admitting: Family Medicine

## 2023-04-07 DIAGNOSIS — R160 Hepatomegaly, not elsewhere classified: Secondary | ICD-10-CM

## 2023-04-07 DIAGNOSIS — K7689 Other specified diseases of liver: Secondary | ICD-10-CM

## 2023-04-09 ENCOUNTER — Ambulatory Visit
Admission: RE | Admit: 2023-04-09 | Discharge: 2023-04-09 | Disposition: A | Discharge status: Home / Self Care | Payer: BC Managed Care – PPO | Source: Ambulatory Visit | Attending: Family Medicine | Admitting: Family Medicine

## 2023-04-09 DIAGNOSIS — G959 Disease of spinal cord, unspecified: Secondary | ICD-10-CM

## 2023-05-05 ENCOUNTER — Encounter: Payer: Self-pay | Admitting: Physician Assistant

## 2023-05-08 ENCOUNTER — Ambulatory Visit
Admission: RE | Admit: 2023-05-08 | Discharge: 2023-05-08 | Disposition: A | Discharge status: Home / Self Care | Payer: BC Managed Care – PPO | Source: Ambulatory Visit | Attending: Physician Assistant | Admitting: Physician Assistant

## 2023-05-08 DIAGNOSIS — R160 Hepatomegaly, not elsewhere classified: Secondary | ICD-10-CM

## 2023-05-08 DIAGNOSIS — K7689 Other specified diseases of liver: Secondary | ICD-10-CM

## 2023-05-08 MED ORDER — GADOPICLENOL 0.5 MMOL/ML IV SOLN
7.0000 mL | Freq: Once | INTRAVENOUS | Status: AC | PRN
  Administered 2023-05-08: 7 mL via INTRAVENOUS

## 2023-05-27 ENCOUNTER — Ambulatory Visit: Payer: BC Managed Care – PPO | Attending: Physician Assistant

## 2023-05-27 DIAGNOSIS — R200 Anesthesia of skin: Secondary | ICD-10-CM | POA: Diagnosis present

## 2023-05-27 DIAGNOSIS — M6281 Muscle weakness (generalized): Secondary | ICD-10-CM | POA: Diagnosis present

## 2023-05-27 DIAGNOSIS — R2689 Other abnormalities of gait and mobility: Secondary | ICD-10-CM | POA: Diagnosis present

## 2023-05-27 DIAGNOSIS — M5412 Radiculopathy, cervical region: Secondary | ICD-10-CM

## 2023-05-27 DIAGNOSIS — R262 Difficulty in walking, not elsewhere classified: Secondary | ICD-10-CM | POA: Diagnosis present

## 2023-05-27 NOTE — Therapy (Signed)
OUTPATIENT PHYSICAL THERAPY NEURO EVALUATION   Patient Name: Melissa Gross MRN: [id number] DOB:15-Jan-1971, 52 y.o., female Today's Date: 05/27/2023   PCP: Patrice Paradise, MD  REFERRING PROVIDER: Patrice Paradise, MD   END OF SESSION:  PT End of Session - 05/27/23 1349     Visit Number 1    Number of Visits 24    Date for PT Re-Evaluation 08/19/23    Authorization Type [employer]    Authorization Time Period As Medically Necessary    Authorization - Visit Number 1    Progress Note Due on Visit 10    PT Start Time 1351    PT Stop Time 1446    PT Time Calculation (min) 55 min             Past Medical History:  Diagnosis Date   Allergy    Diabetes mellitus without complication (HCC)    Thyroid disease    Past Surgical History:  Procedure Laterality Date   CESAREAN SECTION     FRACTURE SURGERY     Patient Active Problem List   Diagnosis Date Noted   Hyperlipidemia due to type 1 diabetes mellitus (HCC) 10/30/2021   Hypothyroidism due to Hashimoto's thyroiditis 10/30/2021   Multinodular goiter 07/20/2019   Attention deficit hyperactivity disorder (ADHD) 08/20/2017   Type 1 diabetes, uncontrolled, with retinopathy 03/07/2015   Hypothyroidism 03/07/2015   Anemia 03/07/2015    ONSET DATE: ~2 years  REFERRING DIAG: R27.8 (ICD-10-CM) - Other lack of coordination   THERAPY DIAG:  Imbalance  Muscle weakness (generalized)  Difficulty in walking, not elsewhere classified  Radiculopathy, cervical region  Bilateral hand numbness  Rationale for Evaluation and Treatment: Rehabilitation  SUBJECTIVE:                                                                                                                                                                                             SUBJECTIVE STATEMENT:  Pt reports she has a 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  Pt states that the  neuropathy that she is experiencing in her LE's is due to having type 1 diabetes.  Pt notes that she is seeking assistance for the stenosis as well as balance and complications from the neuropathy that she is experiencing.  Pt does note that the stenosis is believed to be causing some of the numbness in the UE's.  Pt current works as a Electrical engineer and is just looking at keeping the quality of her life as high as possible.  Pt states the neuropathy  is unlikely to be assisted, but is open to therapy for whatever benefit she can get.    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  Pt also having radicular symptoms in the UE's likely coming from stenosis of the cervical spine.  Pt notes to be having reduced balance and difficulty with mobility recently and has been progressively getting worse over the past 2 years.  Pt is seeking therapy for core stability to prevent increased complications of stenosis, as well as improving her balance and strength that she has lost over the past several years.  Pt is wanting to maintain independence for as long as possible.  PAIN:  Are you having pain? No  PRECAUTIONS: None  WEIGHT BEARING RESTRICTIONS: No  FALLS: Has patient fallen in last 6 months? No  LIVING ENVIRONMENT: Lives with: lives with their family Lives in: House/apartment Stairs: No Has following equipment at home: None  PLOF: Independent  PATIENT GOALS: Pt states she wants to help with balance issues by strengthening the core and alleviate pain in the cervical region that's being caused by stenosis.  OBJECTIVE:   DIAGNOSTIC FINDINGS:   EXAM: MRI CERVICAL SPINE WITHOUT CONTRAST  IMPRESSION: 1. Mild bilateral foraminal narrowing at C3-4 due to uncovertebral spurring and asymmetric left-sided facet hypertrophy. 2. Mild left foraminal narrowing at C4-5 due to  uncovertebral spurring and bilateral facet hypertrophy. 3. No significant central canal stenosis. 4. Normal MRI appearance of the cervical spinal cord.   COGNITION: Overall cognitive status: Within functional limits for tasks assessed   SENSATION: WFL; although pt supports that there is slightly increased numbness on the R LE than the L.    COORDINATION: WNL  EDEMA:  Pt has some pitting edema on the R LE that pt reports will not go away.  POSTURE: rounded shoulders, forward head, and posterior pelvic tilt  LOWER EXTREMITY ROM:     WFL  LOWER EXTREMITY MMT:    MMT Right Eval Left Eval  Hip flexion 4 4+  Hip extension    Hip abduction 4+ 4+  Hip adduction 4+ 4+  Hip internal rotation    Hip external rotation    Knee flexion 4 4-  Knee extension 4 4  Ankle dorsiflexion 2+ 2  Ankle plantarflexion    Ankle inversion    Ankle eversion    (Blank rows = not tested)  Pt has prior history of drop foot.  BED MOBILITY:  No difficulty.   GAIT: Gait pattern: WFL, step through pattern, decreased stride length, decreased ankle dorsiflexion- Right, decreased ankle dorsiflexion- Left, circumduction- Right, narrow BOS, poor foot clearance- Right, and poor foot clearance- Left Distance walked: 40' Assistive device utilized: None Level of assistance: Complete Independence Comments: Pt ambulates with decreased dorsiflexion bilaterally (R>L) that causes the pt to circumduct or increase hip flexion in order to clear the R LE.    FUNCTIONAL TESTS:  5 times sit to stand: 19.34 sec Timed up and go (TUG): 12.42 sec 6 minute walk test: to be determined at next visit 10 meter walk test: 0.96 m/s Dynamic Gait Index: to be determined at next visit  PATIENT SURVEYS:  FOTO 46/55  TODAY'S TREATMENT: DATE: 05/27/23   Eval only    PATIENT EDUCATION: Education details: Pt educated on role of PT and services provided during current POC, along with prognosis and information about  the clinic. Person educated:  Patient Education method: Explanation Education comprehension: verbalized understanding  HOME EXERCISE PROGRAM:  To be given at next visit.  GOALS: Goals reviewed with patient? Yes  SHORT TERM GOALS: Target date: 06/24/2023  Pt will be independent with HEP in order to demonstrate increased ability to perform tasks related to occupation/hobbies. Baseline: Goal status: INITIAL   LONG TERM GOALS: Target date: 08/19/2023  1.  Patient (> 73 years old) will complete five times sit to stand test in < 15 seconds indicating an increased LE strength and improved balance. Baseline: 19.34 sec Goal status: INITIAL  2.  Patient will increase FOTO score to equal to or greater than  55   to demonstrate statistically significant improvement in mobility and quality of life.  Baseline: FOTO: 46 Goal status: INITIAL   3.  Patient will reduce timed up and go to <11 seconds to reduce fall risk and demonstrate improved transfer/gait ability. Baseline: 12.42 sec Goal status: INITIAL  4.  Patient will increase 10 meter walk test to >1.44m/s as to improve gait speed for better community ambulation and to reduce fall risk. Baseline: 0.96 m/s Goal status: INITIAL  5.  Patient will increase six minute walk test distance to >1000 for progression to community ambulator and improve gait ability Baseline: Will perform at next visit. Goal status: INITIAL    ASSESSMENT:  CLINICAL IMPRESSION: Patient is a 52 y.o. female who was seen today for physical therapy evaluation and treatment for lack of coordination.  Pt presents with physical impairments of decreased activity tolerance, decreased balance, decreased sensation bilaterally in UE's and LE's, and decreased generalized strength in B LE's as noted.  Pt will benefit from skilled therapy to address tolerance, ROM, balance, and strength impairments necessary for improvement in quality of life.  Pt. demonstrates understanding of  this plan of care and agrees with this plan.    OBJECTIVE IMPAIRMENTS: Abnormal gait, decreased activity tolerance, decreased balance, decreased endurance, decreased mobility, difficulty walking, decreased strength, and impaired sensation.   ACTIVITY LIMITATIONS: carrying, lifting, bending, sitting, standing, transfers, bathing, dressing, hygiene/grooming, and locomotion level  PARTICIPATION LIMITATIONS: meal prep, cleaning, laundry, driving, shopping, community activity, occupation, and yard work  PERSONAL FACTORS: Age, Education, Past/current experiences, Profession, Time since onset of injury/illness/exacerbation, and 1-2 comorbidities: diabetes  are also affecting patient's functional outcome.   REHAB POTENTIAL: Fair due to multiple body parts and complications from diabetes.  CLINICAL DECISION MAKING: Stable/uncomplicated  EVALUATION COMPLEXITY: Moderate  PLAN:  PT FREQUENCY: 2x/week  PT DURATION: 12 weeks  PLANNED INTERVENTIONS: Therapeutic exercises, Therapeutic activity, Neuromuscular re-education, Balance training, Gait training, Patient/Family education, Self Care, Joint mobilization, Joint manipulation, Stair training, Vestibular training, Canalith repositioning, DME instructions, Dry Needling, Electrical stimulation, Spinal manipulation, Spinal mobilization, Cryotherapy, Moist heat, Taping, Ultrasound, Manual therapy, and Re-evaluation  PLAN FOR NEXT SESSION: Perform , DGI.       Nolon Bussing, PT, DPT Physical Therapist - [facility]  05/27/23, 5:43 PM

## 2023-05-28 ENCOUNTER — Ambulatory Visit: Payer: BC Managed Care – PPO

## 2023-05-28 DIAGNOSIS — M5412 Radiculopathy, cervical region: Secondary | ICD-10-CM

## 2023-05-28 DIAGNOSIS — M6281 Muscle weakness (generalized): Secondary | ICD-10-CM

## 2023-05-28 DIAGNOSIS — R262 Difficulty in walking, not elsewhere classified: Secondary | ICD-10-CM

## 2023-05-28 DIAGNOSIS — R2689 Other abnormalities of gait and mobility: Secondary | ICD-10-CM | POA: Diagnosis not present

## 2023-05-28 DIAGNOSIS — R200 Anesthesia of skin: Secondary | ICD-10-CM

## 2023-05-28 NOTE — Therapy (Signed)
OUTPATIENT PHYSICAL THERAPY NEURO TREATMENT   Patient Name: Melissa Gross MRN: [id number] DOB:02-Nov-1971, 52 y.o., female Today's Date: 05/28/2023   PCP: Patrice Paradise, MD  REFERRING PROVIDER: Patrice Paradise, MD   END OF SESSION:  PT End of Session - 05/28/23 1018     Visit Number 2    Number of Visits 24    Date for PT Re-Evaluation 08/19/23    Authorization Type [employer]    Authorization Time Period As Medically Necessary    Progress Note Due on Visit 10    PT Start Time 1018    PT Stop Time 1100    PT Time Calculation (min) 42 min             Past Medical History:  Diagnosis Date   Allergy    Diabetes mellitus without complication (HCC)    Thyroid disease    Past Surgical History:  Procedure Laterality Date   CESAREAN SECTION     FRACTURE SURGERY     Patient Active Problem List   Diagnosis Date Noted   Hyperlipidemia due to type 1 diabetes mellitus (HCC) 10/30/2021   Hypothyroidism due to Hashimoto's thyroiditis 10/30/2021   Multinodular goiter 07/20/2019   Attention deficit hyperactivity disorder (ADHD) 08/20/2017   Type 1 diabetes, uncontrolled, with retinopathy 03/07/2015   Hypothyroidism 03/07/2015   Anemia 03/07/2015    ONSET DATE: ~2 years  REFERRING DIAG: R27.8 (ICD-10-CM) - Other lack of coordination   THERAPY DIAG:  Imbalance  Muscle weakness (generalized)  Difficulty in walking, not elsewhere classified  Radiculopathy, cervical region  Bilateral hand numbness  Rationale for Evaluation and Treatment: Rehabilitation  SUBJECTIVE:                                                                                                                                                                                             SUBJECTIVE STATEMENT:  Pt reports no status change from initial evaluation yesterday.    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  Pt also having radicular symptoms in the UE's likely coming from stenosis of the cervical spine.  Pt notes to be having reduced balance and difficulty with mobility recently and has been progressively getting worse over the past 2 years.  Pt is seeking therapy for core stability to prevent increased complications of stenosis, as well as improving her balance and strength that she has lost over the past several years.  Pt is wanting to maintain independence for as  long as possible.  PAIN:  Are you having pain? No  PRECAUTIONS: None  WEIGHT BEARING RESTRICTIONS: No  FALLS: Has patient fallen in last 6 months? No  LIVING ENVIRONMENT: Lives with: lives with their family Lives in: House/apartment Stairs: No Has following equipment at home: None  PLOF: Independent  PATIENT GOALS: Pt states she wants to help with balance issues by strengthening the core and alleviate pain in the cervical region that's being caused by stenosis.  OBJECTIVE:   DIAGNOSTIC FINDINGS:   EXAM: MRI CERVICAL SPINE WITHOUT CONTRAST  IMPRESSION: 1. Mild bilateral foraminal narrowing at C3-4 due to uncovertebral spurring and asymmetric left-sided facet hypertrophy. 2. Mild left foraminal narrowing at C4-5 due to uncovertebral spurring and bilateral facet hypertrophy. 3. No significant central canal stenosis. 4. Normal MRI appearance of the cervical spinal cord.   COGNITION: Overall cognitive status: Within functional limits for tasks assessed   SENSATION: WFL; although pt supports that there is slightly increased numbness on the R LE than the L.    COORDINATION: WNL  EDEMA:  Pt has some pitting edema on the R LE that pt reports will not go away.  POSTURE: rounded shoulders, forward head, and posterior pelvic tilt  LOWER EXTREMITY ROM:     WFL  LOWER EXTREMITY MMT:    MMT Right Eval Left Eval  Hip flexion 4 4+  Hip extension     Hip abduction 4+ 4+  Hip adduction 4+ 4+  Hip internal rotation    Hip external rotation    Knee flexion 4 4-  Knee extension 4 4  Ankle dorsiflexion 2+ 2  Ankle plantarflexion    Ankle inversion    Ankle eversion    (Blank rows = not tested)  Pt has prior history of drop foot.  BED MOBILITY:  No difficulty.   GAIT: Gait pattern: WFL, step through pattern, decreased stride length, decreased ankle dorsiflexion- Right, decreased ankle dorsiflexion- Left, circumduction- Right, narrow BOS, poor foot clearance- Right, and poor foot clearance- Left Distance walked: 40' Assistive device utilized: None Level of assistance: Complete Independence Comments: Pt ambulates with decreased dorsiflexion bilaterally (R>L) that causes the pt to circumduct or increase hip flexion in order to clear the R LE.    FUNCTIONAL TESTS:  5 times sit to stand: 19.34 sec Timed up and go (TUG): 12.42 sec 6 minute walk test: 1059' 10 meter walk test: 0.96 m/s Berg Balance Scale: 43/56 Dynamic Gait Index: 20/24  PATIENT SURVEYS:  FOTO 46/55  TODAY'S TREATMENT: DATE: 05/28/23   Neuro:  Static stance on Airex pad, 30 sec bouts Static stance on Airex pad, eyes closed 30 sec bouts Static NBOS on Airex pad, 30 sec bouts    TherAct:  Goal assessment performed and noted below:   PATIENT EDUCATION: Education details: Pt educated on role of PT and services provided during current POC, along with prognosis and information about the clinic. Person educated: Patient Education method: Explanation Education comprehension: verbalized understanding  HOME EXERCISE PROGRAM:  To be given at next visit.  GOALS: Goals reviewed with patient? Yes  SHORT TERM GOALS: Target date: 06/24/2023  Pt will be independent with HEP in order to demonstrate increased ability to perform tasks related to occupation/hobbies. Baseline: Goal status: INITIAL   LONG TERM GOALS: Target date: 08/19/2023  1.  Patient (>  36 years old) will complete five times sit to stand test in < 15 seconds indicating an increased LE strength and improved balance. Baseline: 19.34 sec Goal status: INITIAL  2.  Patient will increase FOTO score to equal to or greater than  55   to demonstrate statistically significant improvement in mobility and quality of life.  Baseline: FOTO: 46 Goal status: INITIAL   3.  Patient will reduce timed up and go to <11 seconds to reduce fall risk and demonstrate improved transfer/gait ability. Baseline: 12.42 sec Goal status: INITIAL  4.  Patient will increase 10 meter walk test to >1.8m/s as to improve gait speed for better community ambulation and to reduce fall risk. Baseline: 0.96 m/s Goal status: INITIAL  5.  Patient will increase six minute walk test distance to >1200 for progression to community ambulator and improve gait ability Baseline: 1059'  Goal status: INITIAL    ASSESSMENT:  CLINICAL IMPRESSION:  Pt responded well to the balance exercises noting difficulty with the airex pad exercises.  Pt states that it simulates the carpet that she has at home and has difficulty with.  Pt also assessed for remaining goals as noted above.  Pt to be given HEP at next visit and will progress with those as necessary.   Pt will continue to benefit from skilled therapy to address remaining deficits in order to improve overall QoL and return to PLOF.       OBJECTIVE IMPAIRMENTS: Abnormal gait, decreased activity tolerance, decreased balance, decreased endurance, decreased mobility, difficulty walking, decreased strength, and impaired sensation.   ACTIVITY LIMITATIONS: carrying, lifting, bending, sitting, standing, transfers, bathing, dressing, hygiene/grooming, and locomotion level  PARTICIPATION LIMITATIONS: meal prep, cleaning, laundry, driving, shopping, community activity, occupation, and yard work  PERSONAL FACTORS: Age, Education, Past/current experiences, Profession, Time since onset  of injury/illness/exacerbation, and 1-2 comorbidities: diabetes  are also affecting patient's functional outcome.   REHAB POTENTIAL: Fair due to multiple body parts and complications from diabetes.  CLINICAL DECISION MAKING: Stable/uncomplicated  EVALUATION COMPLEXITY: Moderate  PLAN:  PT FREQUENCY: 2x/week  PT DURATION: 12 weeks  PLANNED INTERVENTIONS: Therapeutic exercises, Therapeutic activity, Neuromuscular re-education, Balance training, Gait training, Patient/Family education, Self Care, Joint mobilization, Joint manipulation, Stair training, Vestibular training, Canalith repositioning, DME instructions, Dry Needling, Electrical stimulation, Spinal manipulation, Spinal mobilization, Cryotherapy, Moist heat, Taping, Ultrasound, Manual therapy, and Re-evaluation  PLAN FOR NEXT SESSION: Give HEP and continue with balance training.  Include strengthening of the LE's.       Nolon Bussing, PT, DPT Physical Therapist - [facility]  05/28/23, 1:07 PM

## 2023-05-29 ENCOUNTER — Ambulatory Visit: Payer: BC Managed Care – PPO | Admitting: Physical Therapy

## 2023-06-03 ENCOUNTER — Ambulatory Visit: Payer: BC Managed Care – PPO

## 2023-06-10 ENCOUNTER — Ambulatory Visit: Payer: BC Managed Care – PPO

## 2023-06-16 ENCOUNTER — Ambulatory Visit: Payer: BC Managed Care – PPO | Admitting: Physical Therapy

## 2023-06-23 ENCOUNTER — Ambulatory Visit: Payer: BC Managed Care – PPO | Admitting: Physical Therapy

## 2023-06-25 ENCOUNTER — Ambulatory Visit: Payer: BC Managed Care – PPO | Admitting: Physical Therapy

## 2023-07-01 ENCOUNTER — Ambulatory Visit: Payer: BC Managed Care – PPO

## 2023-07-03 ENCOUNTER — Ambulatory Visit: Payer: BC Managed Care – PPO | Admitting: Physical Therapy

## 2023-07-10 ENCOUNTER — Ambulatory Visit: Payer: BC Managed Care – PPO

## 2023-07-10 DIAGNOSIS — Z1211 Encounter for screening for malignant neoplasm of colon: Secondary | ICD-10-CM | POA: Diagnosis not present

## 2023-07-11 ENCOUNTER — Ambulatory Visit: Payer: BC Managed Care – PPO | Admitting: Physical Therapy

## 2023-07-16 ENCOUNTER — Ambulatory Visit: Payer: BC Managed Care – PPO | Admitting: Physical Therapy

## 2023-07-22 ENCOUNTER — Ambulatory Visit: Payer: BC Managed Care – PPO

## 2023-07-24 ENCOUNTER — Ambulatory Visit: Payer: BC Managed Care – PPO

## 2023-07-29 ENCOUNTER — Ambulatory Visit: Payer: BC Managed Care – PPO

## 2023-07-31 ENCOUNTER — Ambulatory Visit: Payer: BC Managed Care – PPO

## 2023-08-06 ENCOUNTER — Ambulatory Visit: Payer: BC Managed Care – PPO | Admitting: Physical Therapy

## 2023-08-11 ENCOUNTER — Ambulatory Visit: Payer: BC Managed Care – PPO

## 2023-08-13 ENCOUNTER — Ambulatory Visit: Payer: BC Managed Care – PPO

## 2023-08-18 ENCOUNTER — Ambulatory Visit: Payer: BC Managed Care – PPO

## 2023-08-20 ENCOUNTER — Ambulatory Visit: Payer: BC Managed Care – PPO

## 2023-08-25 ENCOUNTER — Ambulatory Visit: Payer: BC Managed Care – PPO

## 2023-08-27 ENCOUNTER — Ambulatory Visit: Payer: BC Managed Care – PPO

## 2023-09-01 ENCOUNTER — Ambulatory Visit: Payer: BC Managed Care – PPO

## 2023-09-03 ENCOUNTER — Ambulatory Visit: Payer: BC Managed Care – PPO

## 2024-06-21 ENCOUNTER — Other Ambulatory Visit: Payer: Self-pay | Admitting: Physician Assistant

## 2024-06-21 DIAGNOSIS — Z1231 Encounter for screening mammogram for malignant neoplasm of breast: Secondary | ICD-10-CM

## 2024-08-05 ENCOUNTER — Encounter (INDEPENDENT_AMBULATORY_CARE_PROVIDER_SITE_OTHER): Payer: Self-pay | Admitting: Vascular Surgery

## 2024-08-05 ENCOUNTER — Ambulatory Visit (INDEPENDENT_AMBULATORY_CARE_PROVIDER_SITE_OTHER): Payer: Self-pay | Admitting: Vascular Surgery

## 2024-08-05 VITALS — BP 99/66 | HR 76 | Ht 65.0 in | Wt 190.0 lb

## 2024-08-05 DIAGNOSIS — I739 Peripheral vascular disease, unspecified: Secondary | ICD-10-CM

## 2024-08-05 DIAGNOSIS — E785 Hyperlipidemia, unspecified: Secondary | ICD-10-CM

## 2024-08-05 DIAGNOSIS — I872 Venous insufficiency (chronic) (peripheral): Secondary | ICD-10-CM | POA: Diagnosis not present

## 2024-08-05 DIAGNOSIS — I10 Essential (primary) hypertension: Secondary | ICD-10-CM | POA: Diagnosis not present

## 2024-08-05 DIAGNOSIS — E1051 Type 1 diabetes mellitus with diabetic peripheral angiopathy without gangrene: Secondary | ICD-10-CM | POA: Diagnosis not present

## 2024-08-05 DIAGNOSIS — E1069 Type 1 diabetes mellitus with other specified complication: Secondary | ICD-10-CM

## 2024-08-05 NOTE — Progress Notes (Signed)
 MRN : [id number]  Melissa Gross is a 53 y.o. ([birth year]) female who presents with chief complaint of legs hurt and swell.  History of Present Illness:   Patient is seen for evaluation of leg pain and leg swelling.  She is a type I diabetic.  The patient first noticed the swelling remotely. The swelling is associated with pain and discoloration. The pain and swelling worsens with prolonged dependency and improves with elevation. The pain is unrelated to activity.  The patient notes that in the morning the legs are improved but they steadily worsened throughout the course of the day. The patient also notes a steady worsening of the discoloration in the ankle and shin area.   The patient denies claudication symptoms.  The patient denies symptoms consistent with rest pain.  The patient denies and extensive history of DJD and LS spine disease.  The patient has no had any past angiography, interventions or vascular surgery.  Elevation makes the leg symptoms better, dependency makes them much worse. There is no history of ulcerations. The patient denies any recent changes in medications.  The patient has not been wearing graduated compression on a daily basis.  The patient denies a history of DVT or PE. There is no prior history of phlebitis. There is no history of primary lymphedema.  No history of malignancies. No history of trauma or groin or pelvic surgery. There is no history of radiation treatment to the groin or pelvis  The patient denies amaurosis fugax or recent TIA symptoms. There are no recent neurological changes noted. The patient denies recent episodes of angina or shortness of breath  Current Meds  Medication Sig   albuterol (VENTOLIN HFA) 108 (90 Base) MCG/ACT inhaler Inhale into the lungs.   aspirin 81 MG chewable tablet Chew by mouth.   cetirizine (ZYRTEC) 10 MG tablet Take 10 mg by mouth daily.   fluticasone (FLONASE) 50 MCG/ACT nasal spray Spray 2 spray into  both nostrils once a day NEEDS TO EST WITH NEW PCP AND NEW ENDO MD   insulin  aspart (NOVOLOG ) 100 UNIT/ML injection Use via pump max 60 units per day.   meclizine (ANTIVERT) 12.5 MG tablet Take 12.5 mg by mouth 3 (three) times daily as needed.   pregabalin (LYRICA) 25 MG capsule Take by mouth.   Probiotic Product (PROBIOTIC DAILY PO) Take 1 capsule by mouth daily.    Past Medical History:  Diagnosis Date   Allergy    Diabetes mellitus without complication (HCC)    Thyroid  disease     Past Surgical History:  Procedure Laterality Date   CESAREAN SECTION     FRACTURE SURGERY      Social History Social History   Tobacco Use   Smoking status: Former   Smokeless tobacco: Never  Substance Use Topics   Alcohol use: No    Alcohol/week: 0.0 standard drinks of alcohol   Drug use: No    Family History Family History  Problem Relation Age of Onset   Diabetes Other    Breast cancer Neg Hx     Allergies  Allergen Reactions   Codeine Itching   Nitrofurantoin Hives and Itching   Ranitidine Itching     REVIEW OF SYSTEMS (Negative unless checked)  Constitutional: [] Weight loss  [] Fever  [] Chills Cardiac: [] Chest pain   [] Chest pressure   [] Palpitations   [] Shortness of breath when laying flat   [] Shortness of breath with exertion. Vascular:  [] Pain in legs  with walking   [x] Pain in legs at rest  [] History of DVT   [] Phlebitis   [x] Swelling in legs   [] Varicose veins   [] Non-healing ulcers Pulmonary:   [] Uses home oxygen   [] Productive cough   [] Hemoptysis   [] Wheeze  [] COPD   [] Asthma Neurologic:  [] Dizziness   [] Seizures   [] History of stroke   [] History of TIA  [] Aphasia   [] Vissual changes   [] Weakness or numbness in arm   [] Weakness or numbness in leg Musculoskeletal:   [] Joint swelling   [] Joint pain   [] Low back pain Hematologic:  [] Easy bruising  [] Easy bleeding   [] Hypercoagulable state   [] Anemic Gastrointestinal:  [] Diarrhea   [] Vomiting  [] Gastroesophageal  reflux/heartburn   [] Difficulty swallowing. Genitourinary:  [] Chronic kidney disease   [] Difficult urination  [] Frequent urination   [] Blood in urine Skin:  [] Rashes   [] Ulcers  Psychological:  [] History of anxiety   []  History of major depression.  Physical Examination  Vitals:   08/05/24 1516  Weight: 190 lb (86.2 kg)  Height: 5' 5 (1.651 m)   Body mass index is 31.62 kg/m. Gen: WD/WN, NAD Head: Renn[place]/AT, No temporalis wasting.  Ear/Nose/Throat: Hearing grossly intact, nares w/o erythema or drainage, pinna without lesions Eyes: PER, EOMI, sclera nonicteric.  Neck: Supple, no gross masses.  No JVD.  Pulmonary:  Good air movement, no audible wheezing, no use of accessory muscles.  Cardiac: RRR, precordium not hyperdynamic. Vascular:  scattered varicosities present bilaterally.  Moderate venous stasis changes to the legs bilaterally.  2+ soft pitting edema. CEAP C4sEpAsPr   Vessel Right Left  Radial Palpable Palpable  Gastrointestinal: soft, non-distended. No guarding/no peritoneal signs.  Musculoskeletal: M/S 5/5 throughout.  No deformity.  Neurologic: CN 2-12 intact. Pain and light touch intact in extremities.  Symmetrical.  Speech is fluent. Motor exam as listed above. Psychiatric: Judgment intact, Mood & affect appropriate for pt's clinical situation. Dermatologic: Venous rashes no ulcers noted.  No changes consistent with cellulitis. Lymph : No lichenification or skin changes of chronic lymphedema.  CBC Lab Results  Component Value Date   WBC 5.3 05/23/2015   HGB 14.6 05/23/2015   HCT 44.3 05/23/2015   MCV 82.2 05/23/2015   PLT 303.0 05/23/2015    BMET    Component Value Date/Time   NA 134 (L) 05/23/2015 1158   NA 133 (A) 02/15/2015 0000   NA 138 12/16/2012 0535   K 4.5 05/23/2015 1158   K 4.2 12/16/2012 0535   CL 102 05/23/2015 1158   CL 106 12/16/2012 0535   CO2 26 05/23/2015 1158   CO2 26 12/16/2012 0535   GLUCOSE 199 (H) 05/23/2015 1158   GLUCOSE 92  12/16/2012 0535   BUN 10 05/23/2015 1158   BUN 13 02/15/2015 0000   BUN 5 (L) 12/16/2012 0535   CREATININE 0.68 05/23/2015 1158   CREATININE 0.55 (L) 12/16/2012 0535   CALCIUM 9.4 05/23/2015 1158   CALCIUM 8.6 12/16/2012 0535   GFRNONAA >60 12/16/2012 0535   GFRAA >60 12/16/2012 0535   CrCl cannot be calculated (Patient's most recent lab result is older than the maximum 21 days allowed.).  COAG No results found for: INR, PROTIME  Radiology No results found.   Assessment/Plan 1. Chronic venous insufficiency (Primary) No surgery or intervention at this point in time.   The patient is CEAP C4sEpAsPr   I have discussed with the patient venous insufficiency and why it  causes symptoms. I have discussed with the patient the chronic  skin changes that accompany venous insufficiency and the long term sequela such as infection and ulceration.  Patient will begin wearing graduated compression stockings or compression wraps on a daily basis.  The patient will put the compression on first thing in the morning and removing them in the evening. The patient is instructed specifically not to sleep in the compression.    In addition, behavioral modification including several periods of elevation of the lower extremities during the day will be continued. I have demonstrated that proper elevation is a position with the ankles at heart level.  The patient is instructed to begin routine exercise, especially walking on a daily basis  Patient should undergo duplex ultrasound of the venous system to ensure that DVT or reflux is not present.  Following the review of the ultrasound the patient will follow up in 2-3 months to reassess the degree of swelling and the control that graduated compression stockings or compression wraps  is offering.   At that time the patient can be assessed for a Lymph Pump depending on the effectiveness of conservative therapy and the control of the associated lymphedema. -  VAS US  LOWER EXTREMITY VENOUS REFLUX; Future  2. PAD (peripheral artery disease) (HCC)  Recommend:  The patient has evidence of atherosclerosis of the lower extremities with claudication.  The patient does not voice lifestyle limiting changes at this point in time.  Noninvasive studies do not suggest clinically significant change.  No invasive studies, angiography or surgery at this time The patient should continue walking and begin a more formal exercise program.  The patient should continue antiplatelet therapy and aggressive treatment of the lipid abnormalities  No changes in the patient's medications at this time  Continued surveillance is indicated as atherosclerosis is likely to progress with time.    The patient will continue follow up with noninvasive studies as ordered.  - VAS US  ABI WITH/WO TBI; Future  3. 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  Hgb A1C to be monitored as already arranged by primary service  4. Essential hypertension Continue antihypertensive medications as already ordered, these medications have been reviewed and there are no changes at this time.  5. Hyperlipidemia due to type 1 diabetes mellitus (HCC) Continue statin as ordered and reviewed, no changes at this time    Cordella Shawl, MD  08/05/2024 3:18 PM

## 2024-08-08 ENCOUNTER — Encounter (INDEPENDENT_AMBULATORY_CARE_PROVIDER_SITE_OTHER): Payer: Self-pay | Admitting: Vascular Surgery

## 2024-08-08 DIAGNOSIS — E1051 Type 1 diabetes mellitus with diabetic peripheral angiopathy without gangrene: Secondary | ICD-10-CM | POA: Insufficient documentation

## 2024-08-08 DIAGNOSIS — I739 Peripheral vascular disease, unspecified: Secondary | ICD-10-CM | POA: Insufficient documentation

## 2024-08-08 DIAGNOSIS — I872 Venous insufficiency (chronic) (peripheral): Secondary | ICD-10-CM | POA: Insufficient documentation

## 2024-08-08 DIAGNOSIS — I10 Essential (primary) hypertension: Secondary | ICD-10-CM | POA: Insufficient documentation

## 2024-08-12 ENCOUNTER — Encounter: Payer: Self-pay | Admitting: Radiology

## 2024-08-12 ENCOUNTER — Ambulatory Visit
Admission: RE | Admit: 2024-08-12 | Discharge: 2024-08-12 | Disposition: A | Discharge status: Home / Self Care | Payer: Self-pay | Source: Ambulatory Visit | Attending: Physician Assistant | Admitting: Physician Assistant

## 2024-08-12 DIAGNOSIS — Z1231 Encounter for screening mammogram for malignant neoplasm of breast: Secondary | ICD-10-CM | POA: Insufficient documentation

## 2024-09-09 ENCOUNTER — Other Ambulatory Visit (INDEPENDENT_AMBULATORY_CARE_PROVIDER_SITE_OTHER): Payer: Self-pay | Admitting: Vascular Surgery

## 2024-09-09 DIAGNOSIS — I739 Peripheral vascular disease, unspecified: Secondary | ICD-10-CM

## 2024-09-09 DIAGNOSIS — I872 Venous insufficiency (chronic) (peripheral): Secondary | ICD-10-CM

## 2024-09-13 ENCOUNTER — Encounter (INDEPENDENT_AMBULATORY_CARE_PROVIDER_SITE_OTHER): Payer: Self-pay | Admitting: Vascular Surgery

## 2024-09-13 ENCOUNTER — Ambulatory Visit (INDEPENDENT_AMBULATORY_CARE_PROVIDER_SITE_OTHER): Admitting: Vascular Surgery

## 2024-09-13 ENCOUNTER — Ambulatory Visit (INDEPENDENT_AMBULATORY_CARE_PROVIDER_SITE_OTHER)

## 2024-09-13 VITALS — BP 119/77 | HR 66 | Resp 16 | Ht 65.0 in | Wt 192.4 lb

## 2024-09-13 DIAGNOSIS — I10 Essential (primary) hypertension: Secondary | ICD-10-CM

## 2024-09-13 DIAGNOSIS — I739 Peripheral vascular disease, unspecified: Secondary | ICD-10-CM

## 2024-09-13 DIAGNOSIS — E1051 Type 1 diabetes mellitus with diabetic peripheral angiopathy without gangrene: Secondary | ICD-10-CM | POA: Diagnosis not present

## 2024-09-13 DIAGNOSIS — I890 Lymphedema, not elsewhere classified: Secondary | ICD-10-CM | POA: Diagnosis not present

## 2024-09-13 DIAGNOSIS — I872 Venous insufficiency (chronic) (peripheral): Secondary | ICD-10-CM

## 2024-09-22 ENCOUNTER — Encounter (INDEPENDENT_AMBULATORY_CARE_PROVIDER_SITE_OTHER): Payer: Self-pay | Admitting: Vascular Surgery

## 2024-09-22 DIAGNOSIS — I890 Lymphedema, not elsewhere classified: Secondary | ICD-10-CM | POA: Insufficient documentation

## 2024-09-22 NOTE — Progress Notes (Signed)
 MRN : [id number]  Melissa Gross is a 53 y.o. (Feb 26, 1971) female who presents with chief complaint of legs swell.  History of Present Illness:   The patient returns to the office for followup evaluation regarding leg swelling.  The swelling has persisted and the pain associated with swelling continues. There have not been any interval development of a ulcerations or wounds.  Since the previous visit the patient has been wearing graduated compression stockings and has noted little if any improvement in the lymphedema. The patient has been using compression routinely morning until night.  The patient also states elevation during the day and exercise is being done too.     Duplex ultrasound venous system demonstrates normal deep venous system bilaterally.  No evidence of significant superficial reflux is identified.  ABIs Rt=1.19 (triphasic) and Lt=1.12 (triphasic)  Current Meds  Medication Sig   albuterol (VENTOLIN HFA) 108 (90 Base) MCG/ACT inhaler Inhale into the lungs.   aspirin 81 MG chewable tablet Chew by mouth.   cetirizine (ZYRTEC) 10 MG tablet Take 10 mg by mouth daily.   [EXPIRED] ciprofloxacin (CIPRO) 250 MG tablet Take 250 mg by mouth.   CRANBERRY CONCENTRATE PO Take 1 capsule by mouth daily.   docusate sodium (COLACE) 100 MG capsule Take 100 mg by mouth daily.   ferrous sulfate 325 (65 FE) MG tablet Take 325 mg by mouth 2 (two) times daily with a meal.   insulin  aspart (NOVOLOG ) 100 UNIT/ML injection Use via pump max 60 units per day.   levothyroxine  (SYNTHROID ) 125 MCG tablet Take by mouth.   losartan (COZAAR) 25 MG tablet Take 25 mg by mouth daily.   meclizine (ANTIVERT) 12.5 MG tablet Take 12.5 mg by mouth 3 (three) times daily as needed.   metroNIDAZOLE (METROGEL) 0.75 % gel Apply topically.   omeprazole (PRILOSEC) 20 MG capsule Take by mouth.   pregabalin (LYRICA) 25 MG capsule Take by mouth.   Probiotic Product (PROBIOTIC DAILY PO)  Take 1 capsule by mouth daily.   Vitamin D, Ergocalciferol, (DRISDOL) 1.25 MG (50000 UNIT) CAPS capsule Take 50,000 Units by mouth once a week.    Past Medical History:  Diagnosis Date   Allergy    Diabetes mellitus without complication (HCC)    Thyroid  disease     Past Surgical History:  Procedure Laterality Date   CESAREAN SECTION     FRACTURE SURGERY      Social History Social History   Tobacco Use   Smoking status: Former   Smokeless tobacco: Never  Substance Use Topics   Alcohol use: No    Alcohol/week: 0.0 standard drinks of alcohol   Drug use: No    Family History Family History  Problem Relation Age of Onset   Diabetes Other    Breast cancer Neg Hx     Allergies  Allergen Reactions   Codeine Itching   Nitrofurantoin Hives and Itching   Ranitidine Itching     REVIEW OF SYSTEMS (Negative unless checked)  Constitutional: [] Weight loss  [] Fever  [] Chills Cardiac: [] Chest pain   [] Chest pressure   [] Palpitations   [] Shortness of breath when laying flat   [] Shortness of breath with exertion. Vascular:  [] Pain in legs with walking   [x] Pain in legs with standing  [] History of DVT   [] Phlebitis   [x] Swelling in legs   []   Varicose veins   [] Non-healing ulcers Pulmonary:   [] Uses home oxygen   [] Productive cough   [] Hemoptysis   [] Wheeze  [] COPD   [] Asthma Neurologic:  [] Dizziness   [] Seizures   [] History of stroke   [] History of TIA  [] Aphasia   [] Vissual changes   [] Weakness or numbness in arm   [] Weakness or numbness in leg Musculoskeletal:   [] Joint swelling   [] Joint pain   [] Low back pain Hematologic:  [] Easy bruising  [] Easy bleeding   [] Hypercoagulable state   [] Anemic Gastrointestinal:  [] Diarrhea   [] Vomiting  [] Gastroesophageal reflux/heartburn   [] Difficulty swallowing. Genitourinary:  [] Chronic kidney disease   [] Difficult urination  [] Frequent urination   [] Blood in urine Skin:  [] Rashes   [] Ulcers  Psychological:  [] History of anxiety   []  History  of major depression.  Physical Examination  Vitals:   09/13/24 1401  BP: 119/77  Pulse: 66  Resp: 16  Weight: 192 lb 6.4 oz (87.3 kg)  Height: 5' 5 (1.651 m)   Body mass index is 32.02 kg/m. Gen: WD/WN, NAD Head: [place]/AT, No temporalis wasting.  Ear/Nose/Throat: Hearing grossly intact, nares w/o erythema or drainage, pinna without lesions Eyes: PER, EOMI, sclera nonicteric.  Neck: Supple, no gross masses.  No JVD.  Pulmonary:  Good air movement, no audible wheezing, no use of accessory muscles.  Cardiac: RRR, precordium not hyperdynamic. Vascular:  scattered varicosities present bilaterally.  Mild venous stasis changes to the legs bilaterally.  3-4+ soft pitting edema, CEAP C4sEpAsPr  Vessel Right Left  Radial Palpable Palpable  Gastrointestinal: soft, non-distended. No guarding/no peritoneal signs.  Musculoskeletal: M/S 5/5 throughout.  No deformity.  Neurologic: CN 2-12 intact. Pain and light touch intact in extremities.  Symmetrical.  Speech is fluent. Motor exam as listed above. Psychiatric: Judgment intact, Mood & affect appropriate for pt's clinical situation. Dermatologic: Venous rashes no ulcers noted.  No changes consistent with cellulitis. Lymph : No lichenification or skin changes of chronic lymphedema.  CBC Lab Results  Component Value Date   WBC 5.3 05/23/2015   HGB 14.6 05/23/2015   HCT 44.3 05/23/2015   MCV 82.2 05/23/2015   PLT 303.0 05/23/2015    BMET    Component Value Date/Time   NA 134 (L) 05/23/2015 1158   NA 133 (A) 02/15/2015 0000   NA 138 12/16/2012 0535   K 4.5 05/23/2015 1158   K 4.2 12/16/2012 0535   CL 102 05/23/2015 1158   CL 106 12/16/2012 0535   CO2 26 05/23/2015 1158   CO2 26 12/16/2012 0535   GLUCOSE 199 (H) 05/23/2015 1158   GLUCOSE 92 12/16/2012 0535   BUN 10 05/23/2015 1158   BUN 13 02/15/2015 0000   BUN 5 (L) 12/16/2012 0535   CREATININE 0.68 05/23/2015 1158   CREATININE 0.55 (L) 12/16/2012 0535   CALCIUM 9.4 05/23/2015  1158   CALCIUM 8.6 12/16/2012 0535   GFRNONAA >60 12/16/2012 0535   GFRAA >60 12/16/2012 0535   CrCl cannot be calculated (Patient's most recent lab result is older than the maximum 21 days allowed.).  COAG No results found for: INR, PROTIME  Radiology VAS US  ABI WITH/WO TBI Result Date: 09/13/2024  LOWER EXTREMITY DOPPLER STUDY Patient Name:  NEENA BEECHAM  Date of Exam:   09/13/2024 Medical Rec #: [id number]          Accession #:    [id number] Date of Birth: 03-12-1971           Patient Gender: F Patient Age:  53 years Exam Location:  [facility] Procedure:      VAS US  ABI WITH/WO TBI Referring Phys: Makenize Messman --------------------------------------------------------------------------------  Indications: Peripheral artery disease.  Performing Technologist: Elsie Churn RT, RDMS, RVT  Examination Guidelines: A complete evaluation includes at minimum, Doppler waveform signals and systolic blood pressure reading at the level of bilateral brachial, anterior tibial, and posterior tibial arteries, when vessel segments are accessible. Bilateral testing is considered an integral part of a complete examination. Photoelectric Plethysmograph (PPG) waveforms and toe systolic pressure readings are included as required and additional duplex testing as needed. Limited examinations for reoccurring indications may be performed as noted.  ABI Findings: +---------+------------------+-----+-----------+--------+ Right    Rt Pressure (mmHg)IndexWaveform   Comment  +---------+------------------+-----+-----------+--------+ Brachial 128                                        +---------+------------------+-----+-----------+--------+ PTA      146               1.14 multiphasic         +---------+------------------+-----+-----------+--------+ DP       152               1.19 multiphasic         +---------+------------------+-----+-----------+--------+ Great Toe108                0.84 Normal              +---------+------------------+-----+-----------+--------+ +---------+------------------+-----+-----------+-------+ Left     Lt Pressure (mmHg)IndexWaveform   Comment +---------+------------------+-----+-----------+-------+ Brachial 119                                       +---------+------------------+-----+-----------+-------+ PTA      143               1.12 multiphasic        +---------+------------------+-----+-----------+-------+ DP       138               1.08 multiphasic        +---------+------------------+-----+-----------+-------+ Great Toe108               0.84 Normal             +---------+------------------+-----+-----------+-------+  Summary: Bilateral: Bilateral ankle-brachial indexes are within normal range. Bilateral toe-brachial indexes are within normal range.  *See table(s) above for measurements and observations. Electronically signed by Cordella Shawl MD on 09/13/2024 at 4:53:31 PM.    Final    VAS US  LOWER EXTREMITY VENOUS REFLUX Result Date: 09/13/2024  Lower Venous Reflux Study Patient Name:  RHYLEE PUCILLO [facility]  Date of Exam:   09/13/2024 Medical Rec #: [id number]          Accession #:    [id number] Date of Birth: 1971/03/16           Patient Gender: F Patient Age:   29 years Exam Location:  [facility] Procedure:      VAS US  LOWER EXTREMITY VENOUS REFLUX Referring Phys: CORDELLA SHAWL --------------------------------------------------------------------------------  Indications: Chronic venous insufficiency.  Performing Technologist: Elsie Churn RT, RDMS, RVT  Examination Guidelines: A complete evaluation includes B-mode imaging, spectral Doppler, color Doppler, and power Doppler as needed of all accessible portions of each vessel. Bilateral testing is considered an integral part of a complete  examination. Limited examinations for reoccurring indications may be performed as noted. The reflux portion of the exam is  performed with the patient in reverse Trendelenburg. Significant venous reflux is defined as >500 ms in the superficial venous system, and >1 second in the deep venous system.  Venous Reflux Times +--------------+---------+------+-----------+------------+--------+ RIGHT         Reflux NoRefluxReflux TimeDiameter cmsComments                         Yes                                  +--------------+---------+------+-----------+------------+--------+ CFV           no                                             +--------------+---------+------+-----------+------------+--------+ FV mid        no                                             +--------------+---------+------+-----------+------------+--------+ Popliteal     no                                             +--------------+---------+------+-----------+------------+--------+ GSV at SFJ    no                                             +--------------+---------+------+-----------+------------+--------+ GSV prox thighno                                             +--------------+---------+------+-----------+------------+--------+ GSV at knee   no                                             +--------------+---------+------+-----------+------------+--------+ SSV prox calf no                                             +--------------+---------+------+-----------+------------+--------+  +--------------+---------+------+-----------+------------+--------+ LEFT          Reflux NoRefluxReflux TimeDiameter cmsComments                         Yes                                  +--------------+---------+------+-----------+------------+--------+ CFV           no                                             +--------------+---------+------+-----------+------------+--------+  FV mid        no                                              +--------------+---------+------+-----------+------------+--------+ Popliteal     no                                             +--------------+---------+------+-----------+------------+--------+ GSV at SFJ    no                                             +--------------+---------+------+-----------+------------+--------+ GSV prox thighno                                             +--------------+---------+------+-----------+------------+--------+ GSV at knee   no                                             +--------------+---------+------+-----------+------------+--------+ SSV prox calf no                                             +--------------+---------+------+-----------+------------+--------+  Summary: Bilateral: - No evidence of deep vein thrombosis seen in the lower extremities, bilaterally, from the common femoral through the popliteal veins. - No evidence of superficial venous thrombosis in the lower extremities, bilaterally. - No evidence of deep venous insufficiency seen bilaterally in the lower extremity. - No evidence of superficial venous reflux seen in the greater saphenous veins bilaterally. - No evidence of superficial venous reflux seen in the short saphenous veins bilaterally.  *See table(s) above for measurements and observations. Electronically signed by Cordella Shawl MD on 09/13/2024 at 4:53:26 PM.    Final      Assessment/Plan 1. Lymphedema (Primary) Recommend:  No surgery or intervention at this point in time.   The Patient is CEAP C4sEpAsPr.  The patient has been wearing compression for more than 12 weeks with no or little benefit.  The patient has been exercising daily for more than 12 weeks. The patient has been elevating and taking OTC pain medications for more than 12 weeks.  None of these have have eliminated the pain related to the lymphedema or the discomfort regarding excessive swelling and venous congestion.    I have reviewed  my discussion with the patient regarding lymphedema and why it  causes symptoms.  Patient will continue wearing graduated compression on a daily basis. The patient should put the compression on first thing in the morning and removing them in the evening. The patient should not sleep in the compression.   In addition, behavioral modification throughout the day will be continued.  This will include frequent elevation (such as in a recliner), use of over the counter pain medications as needed and exercise such as walking.  The systemic causes for chronic  edema such as liver, kidney and cardiac etiologies do not appear to have significant changed over the past year.    The patient has chronic , severe lymphedema with hyperpigmentation of the skin and has done MLD, skin care, medication, diet, exercise, elevation and compression for 4 weeks with no improvement,  I am recommending a lymphedema pump.  The patient still has stage 3 lymphedema and therefore, I believe that a lymph pump is needed to improve the control of the patient's lymphedema and improve the quality of life.  Additionally, a lymph pump is warranted because it will reduce the risk of cellulitis and ulceration in the future.  Patient should follow-up in six months   2. Chronic venous insufficiency Recommend:  No surgery or intervention at this point in time.   The Patient is CEAP C4sEpAsPr.  The patient has been wearing compression for more than 12 weeks with no or little benefit.  The patient has been exercising daily for more than 12 weeks. The patient has been elevating and taking OTC pain medications for more than 12 weeks.  None of these have have eliminated the pain related to the lymphedema or the discomfort regarding excessive swelling and venous congestion.    I have reviewed my discussion with the patient regarding lymphedema and why it  causes symptoms.  Patient will continue wearing graduated compression on a daily basis. The  patient should put the compression on first thing in the morning and removing them in the evening. The patient should not sleep in the compression.   In addition, behavioral modification throughout the day will be continued.  This will include frequent elevation (such as in a recliner), use of over the counter pain medications as needed and exercise such as walking.  The systemic causes for chronic edema such as liver, kidney and cardiac etiologies do not appear to have significant changed over the past year.    The patient has chronic , severe lymphedema with hyperpigmentation of the skin and has done MLD, skin care, medication, diet, exercise, elevation and compression for 4 weeks with no improvement,  I am recommending a lymphedema pump.  The patient still has stage 3 lymphedema and therefore, I believe that a lymph pump is needed to improve the control of the patient's lymphedema and improve the quality of life.  Additionally, a lymph pump is warranted because it will reduce the risk of cellulitis and ulceration in the future.  Patient should follow-up in six months   3. PAD (peripheral artery disease) Recommend:  I do not find evidence of life style limiting vascular disease. The patient specifically denies life style limitation.  Previous noninvasive studies including ABI's of the legs do not identify critical vascular problems.  The patient should continue walking and begin a more formal exercise program. The patient should continue his antiplatelet therapy and aggressive treatment of the lipid abnormalities.  The patient is instructed to call the office if there is a significant change in the lower extremity symptoms, particularly if a wound develops or there is an abrupt increase in leg pain.  4. 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  Hgb A1C to be monitored as already arranged by primary service  5. Essential hypertension Continue antihypertensive medications as already ordered, these medications have been reviewed and there are no changes at this time.    Cordella Shawl,  MD  09/22/2024 12:04 PM

## 2025-03-14 ENCOUNTER — Ambulatory Visit (INDEPENDENT_AMBULATORY_CARE_PROVIDER_SITE_OTHER): Admitting: Vascular Surgery
